# Patient Record
Sex: Male | Born: 1965 | Race: White | Hispanic: No | Marital: Married | Smoking: Never smoker
Health system: Southern US, Community
[De-identification: ages and names within clinical notes are randomized; demographics above are authoritative.]

## PROBLEM LIST (undated history)

## (undated) DIAGNOSIS — I10 Essential (primary) hypertension: Secondary | ICD-10-CM

## (undated) DIAGNOSIS — E785 Hyperlipidemia, unspecified: Secondary | ICD-10-CM

## (undated) DIAGNOSIS — I4891 Unspecified atrial fibrillation: Secondary | ICD-10-CM

## (undated) DIAGNOSIS — R931 Abnormal findings on diagnostic imaging of heart and coronary circulation: Secondary | ICD-10-CM

## (undated) DIAGNOSIS — K219 Gastro-esophageal reflux disease without esophagitis: Secondary | ICD-10-CM

## (undated) HISTORY — PX: POLYPECTOMY: SHX149

## (undated) HISTORY — PX: TONSILLECTOMY AND ADENOIDECTOMY: SHX28

## (undated) HISTORY — DX: Abnormal findings on diagnostic imaging of heart and coronary circulation: R93.1

## (undated) HISTORY — DX: Unspecified atrial fibrillation: I48.91

## (undated) HISTORY — PX: HERNIA REPAIR: SHX51

## (undated) HISTORY — DX: Gastro-esophageal reflux disease without esophagitis: K21.9

## (undated) HISTORY — PX: COLONOSCOPY: SHX174

## (undated) HISTORY — DX: Essential (primary) hypertension: I10

---

## 1993-10-22 HISTORY — PX: ANTERIOR CRUCIATE LIGAMENT REPAIR: SHX115

## 2011-10-01 ENCOUNTER — Ambulatory Visit (INDEPENDENT_AMBULATORY_CARE_PROVIDER_SITE_OTHER): Payer: BC Managed Care – PPO

## 2011-10-01 DIAGNOSIS — Z23 Encounter for immunization: Secondary | ICD-10-CM

## 2011-10-01 DIAGNOSIS — J069 Acute upper respiratory infection, unspecified: Secondary | ICD-10-CM

## 2012-02-21 ENCOUNTER — Encounter: Payer: Self-pay | Admitting: Family Medicine

## 2012-02-21 ENCOUNTER — Ambulatory Visit (INDEPENDENT_AMBULATORY_CARE_PROVIDER_SITE_OTHER): Payer: BC Managed Care – PPO | Admitting: Family Medicine

## 2012-02-21 VITALS — BP 147/99 | HR 62 | Temp 97.4°F | Resp 16 | Ht 72.5 in | Wt 193.4 lb

## 2012-02-21 DIAGNOSIS — Z23 Encounter for immunization: Secondary | ICD-10-CM

## 2012-02-21 DIAGNOSIS — R030 Elevated blood-pressure reading, without diagnosis of hypertension: Secondary | ICD-10-CM

## 2012-02-21 DIAGNOSIS — Z808 Family history of malignant neoplasm of other organs or systems: Secondary | ICD-10-CM

## 2012-02-21 DIAGNOSIS — Z0000 Encounter for general adult medical examination without abnormal findings: Secondary | ICD-10-CM

## 2012-02-21 DIAGNOSIS — Z13220 Encounter for screening for lipoid disorders: Secondary | ICD-10-CM

## 2012-02-21 LAB — CBC WITH DIFFERENTIAL/PLATELET
Basophils Relative: 1 % (ref 0–1)
Eosinophils Absolute: 0.2 10*3/uL (ref 0.0–0.7)
Eosinophils Relative: 4 % (ref 0–5)
HCT: 44.8 % (ref 39.0–52.0)
Hemoglobin: 15.9 g/dL (ref 13.0–17.0)
Lymphs Abs: 1.6 10*3/uL (ref 0.7–4.0)
MCH: 30.2 pg (ref 26.0–34.0)
MCHC: 35.5 g/dL (ref 30.0–36.0)
MCV: 85.2 fL (ref 78.0–100.0)
Monocytes Absolute: 0.4 10*3/uL (ref 0.1–1.0)
Monocytes Relative: 8 % (ref 3–12)
Neutrophils Relative %: 57 % (ref 43–77)
RBC: 5.26 MIL/uL (ref 4.22–5.81)

## 2012-02-21 LAB — LIPID PANEL
Cholesterol: 226 mg/dL — ABNORMAL HIGH (ref 0–200)
Triglycerides: 136 mg/dL (ref <150)
VLDL: 27 mg/dL (ref 0–40)

## 2012-02-21 LAB — POCT URINALYSIS DIPSTICK
Bilirubin, UA: NEGATIVE
Blood, UA: NEGATIVE
Glucose, UA: NEGATIVE
Ketones, UA: NEGATIVE
Spec Grav, UA: 1.02
pH, UA: 6

## 2012-02-21 LAB — COMPREHENSIVE METABOLIC PANEL
CO2: 25 mEq/L (ref 19–32)
Calcium: 9.3 mg/dL (ref 8.4–10.5)
Chloride: 105 mEq/L (ref 96–112)
Glucose, Bld: 86 mg/dL (ref 70–99)
Sodium: 139 mEq/L (ref 135–145)
Total Bilirubin: 0.9 mg/dL (ref 0.3–1.2)
Total Protein: 6.9 g/dL (ref 6.0–8.3)

## 2012-02-21 NOTE — Patient Instructions (Addendum)
Vitamin D Deficiency  Not having enough vitamin D is called a deficiency. Your body needs this vitamin to keep your bones strong and healthy. Having too little of it can make your bones soft or can cause other health problems.  HOME CARE  Take all vitamins, herbs, or nutrition drinks (supplements) as told by your doctor.   Have your blood tested 2 months after taking vitamins, herbs, or nutrition drinks.   Eat foods that have vitamin D. This includes:   Dairy products, cereals, or juices with added vitamin D. Check the label.   Fatty fish like salmon or trout.   Eggs.   Oysters.   Go outside for 10 to 15 minutes when the sun is shining. Do this 3 times a week. Do not do this if you have skin cancer.   Do not use tanning beds.   Stay at a healthy weight. Lose weight if needed.   Keep all doctor visits as told.  GET HELP IF:  You have questions.   You continue to have problems.   You feel sick to your stomach (nauseous) or throw up (vomit).   You cannot go poop (constipated).   You feel confused.   You have severe belly (abdominal) or back pain.  MAKE SURE YOU:  Understand these instructions.   Will watch your condition.   Will get help right away if you are not doing well or get worse.  Document Released: 09/27/2011 Document Reviewed: 09/25/2011 ExitCare Patient Information 2012 [place], [facility]     Testicular Problems and Self-Exam Men can examine themselves easily and effectively with positive results. Monthly exams detect problems early and save lives. There are numerous causes of swelling in the testicle. Testicular cancer usually appears as a firm painless lump in the front part of the testicle. This may feel like a dull ache or heavy feeling located in the lower abdomen (belly), groin, or scrotum.  The risk is greater in men with undescended testicles and it is more common in young men. It is responsible for almost a fifth of cancers in males between ages 36  and 29. Other common causes of swellings, lumps, and testicular pain include injuries, inflammation (soreness) from infection, hydrocele, and torsion. These are a few of the reasons to do monthly self-examination of the testicles. The exam only takes minutes and could add years to your life. Get in the habit! SELF-EXAMINATION OF THE TESTICLES The testicles are easiest to examine after warm baths or showers and are more difficult to examine when you are cold. This is because the muscles attached to the testicles retract and pull them up higher or into the abdomen. While standing, roll one testicle between the thumb and forefinger. Feel for lumps, swelling, or discomfort. A normal testicle is egg shaped and feels firm. It is smooth and not tender. The spermatic cord can be felt as a firm spaghetti-like cord at the back of the testicle. It is also important to examine your groins. This is the crease between the front of your leg and your abdomen. Also, feel for enlarged lymph nodes (glands). Enlarged nodes are also a cause for you to see your caregiver for evaluation.  Self-examination of the testicles and groin areas on a regular basis will help you to know what your own testicles and groins feel like. This will help you pick up an abnormality (difference) at an earlier stage. Early discovery is the key to curing this cancer or treating other conditions. Any lump, change,  or swelling in the testicle calls for immediate evaluation by your caregiver. Cancer of the testicle does not result in impotence and it does not prevent normal intercourse or prevent having children. If your caregiver feels that medical treatment or chemotherapy could lead to infertility, sperm can be frozen for future use. It is necessary to see a caregiver as soon as possible after the discovery of a lump in a testicle. Document Released: 01/14/2001 Document Revised: 09/27/2011 Document Reviewed: 10/09/2008 [facility] Patient Information 2012  [place], [place].    Marland KitchenKeeping you healthy  Get these tests  Blood pressure- Have your blood pressure checked once a year by your healthcare provider.  Normal blood pressure is 120/80.  Weight- Have your body mass index (BMI) calculated to screen for obesity.  BMI is a measure of body fat based on height and weight. You can also calculate your own BMI at https://www.west-esparza.com/.  Cholesterol- Have your cholesterol checked regularly starting at age 63, sooner may be necessary if you have diabetes, high blood pressure, if a family member developed heart diseases at an early age or if you smoke.   Chlamydia, HIV, and other sexual transmitted disease- Get screened each year until the age of 72 then within three months of each new sexual partner.  Diabetes- Have your blood sugar checked regularly if you have high blood pressure, high cholesterol, a family history of diabetes or if you are overweight.  Get these vaccines  Flu shot- Every fall.  Tetanus shot- Every 10 years.  Menactra- Single dose; prevents meningitis.  Take these steps  Don't smoke- If you do smoke, ask your healthcare provider about quitting. For tips on how to quit, go to www.smokefree.gov or call 1-800-QUIT-NOW.  Be physically active- Exercise 5 days a week for at least 30 minutes.  If you are not already physically active start slow and gradually work up to 30 minutes of moderate physical activity.  Examples of moderate activity include walking briskly, mowing the yard, dancing, swimming bicycling, etc.  Eat a healthy diet- Eat a variety of healthy foods such as fruits, vegetables, low fat milk, low fat cheese, yogurt, lean meats, poultry, fish, beans, tofu, etc.  For more information on healthy eating, go to www.thenutritionsource.org  Drink alcohol in moderation- Limit alcohol intake two drinks or less a day.  Never drink and drive.  Dentist- Brush and floss teeth twice daily; visit your dentis twice a  year.  Depression-Your emotional health is as important as your physical health.  If you're feeling down, losing interest in things you normally enjoy please talk with your healthcare provider.  Gun Safety- If you keep a gun in your home, keep it unloaded and with the safety lock on.  Bullets should be stored separately.  Helmet use- Always wear a helmet when riding a motorcycle, bicycle, rollerblading or skateboarding.  Safe sex- If you may be exposed to a sexually transmitted infection, use a condom  Seat belts- Seat bels can save your life; always wear one.  Smoke/Carbon Monoxide detectors- These detectors need to be installed on the appropriate level of your home.  Replace batteries at least once a year.  Skin Cancer- When out in the sun, cover up and use sunscreen SPF 15 or higher. Violence- If anyone is threatening or hurting you, please tell your healthcare provider.    Hypertension As your heart beats, it forces blood through your arteries. This force is your blood pressure. If the pressure is too high, it is called hypertension (  HTN) or high blood pressure. HTN is dangerous because you may have it and not know it. High blood pressure may mean that your heart has to work harder to pump blood. Your arteries may be narrow or stiff. The extra work puts you at risk for heart disease, stroke, and other problems.  Blood pressure consists of two numbers, a higher number over a lower, 110/72, for example. It is stated as "110 over 72." The ideal is below 120 for the top number (systolic) and under 80 for the bottom (diastolic). Write down your blood pressure today. You should pay close attention to your blood pressure if you have certain conditions such as: Heart failure.  Prior heart attack.  Diabetes  Chronic kidney disease.  Prior stroke.  Multiple risk factors for heart disease.  To see if you have HTN, your blood pressure should be measured while you are seated with your arm held at  the level of the heart. It should be measured at least twice. A one-time elevated blood pressure reading (especially in the Emergency Department) does not mean that you need treatment. There may be conditions in which the blood pressure is different between your right and left arms. It is important to see your caregiver soon for a recheck. Most people have essential hypertension which means that there is not a specific cause. This type of high blood pressure may be lowered by changing lifestyle factors such as: Stress.  Smoking.  Lack of exercise.  Excessive weight.  Drug/tobacco/alcohol use.  Eating less salt.  Most people do not have symptoms from high blood pressure until it has caused damage to the body. Effective treatment can often prevent, delay or reduce that damage. TREATMENT  When a cause has been identified, treatment for high blood pressure is directed at the cause. There are a large number of medications to treat HTN. These fall into several categories, and your caregiver will help you select the medicines that are best for you. Medications may have side effects. You should review side effects with your caregiver. If your blood pressure stays high after you have made lifestyle changes or started on medicines,  Your medication(s) may need to be changed.  Other problems may need to be addressed.  Be certain you understand your prescriptions, and know how and when to take your medicine.  Be sure to follow up with your caregiver within the time frame advised (usually within two weeks) to have your blood pressure rechecked and to review your medications.  If you are taking more than one medicine to lower your blood pressure, make sure you know how and at what times they should be taken. Taking two medicines at the same time can result in blood pressure that is too low.  SEEK IMMEDIATE MEDICAL CARE IF: You develop a severe headache, blurred or changing vision, or confusion.  You have  unusual weakness or numbness, or a faint feeling.  You have severe chest or abdominal pain, vomiting, or breathing problems.  MAKE SURE YOU:  Understand these instructions.  Will watch your condition.  Will get help right away if you are not doing well or get worse.  Document Released: 10/08/2005 Document Revised: 09/27/2011 Document Reviewed: 05/28/2008  [facility] Patient Information 2012 [place], [place].

## 2012-02-23 ENCOUNTER — Encounter: Payer: Self-pay | Admitting: Family Medicine

## 2012-02-23 DIAGNOSIS — J309 Allergic rhinitis, unspecified: Secondary | ICD-10-CM | POA: Insufficient documentation

## 2012-02-23 DIAGNOSIS — Z808 Family history of malignant neoplasm of other organs or systems: Secondary | ICD-10-CM | POA: Insufficient documentation

## 2012-02-23 DIAGNOSIS — E785 Hyperlipidemia, unspecified: Secondary | ICD-10-CM | POA: Insufficient documentation

## 2012-02-23 DIAGNOSIS — R030 Elevated blood-pressure reading, without diagnosis of hypertension: Secondary | ICD-10-CM | POA: Insufficient documentation

## 2012-02-23 NOTE — Progress Notes (Signed)
  Subjective:    Patient ID: John Odonnell, male    DOB: 24-Apr-1966, 45 y.o.   MRN: [id number]  HPI  This Cauc male is here for CPE. He reports no chronic problems except joint pain and stiffness.  He is married with 2 children, is a nonsmoker who consumes 6-8 drinks monthly and works as an Scientist, product/process development. He stays active playing tennis and other activities.  Last eye exam: 09/2011 Last dental exam: 2012    Review of Systems  Musculoskeletal: Positive for arthralgias. Negative for myalgias, joint swelling and gait problem.  All other systems reviewed and are negative.       Objective:   Physical Exam  Nursing note and vitals reviewed. Constitutional: He is oriented to person, place, and time. He appears well-developed and well-nourished. No distress.  HENT:  Head: Normocephalic and atraumatic.  Right Ear: External ear normal.  Left Ear: External ear normal.  Nose: Nose normal.  Mouth/Throat: Oropharynx is clear and moist.  Eyes: Conjunctivae and EOM are normal. Pupils are equal, round, and reactive to light. No scleral icterus.  Neck: Normal range of motion. Neck supple. No thyromegaly present.  Cardiovascular: Normal rate, regular rhythm and normal heart sounds.  Exam reveals no gallop and no friction rub.   No murmur heard. Pulmonary/Chest: Effort normal and breath sounds normal. No respiratory distress.  Abdominal: Soft. Bowel sounds are normal. He exhibits no mass. There is no tenderness. There is no guarding.  Genitourinary: Testes normal and penis normal.  Musculoskeletal: Normal range of motion. He exhibits no edema and no tenderness.       Well healed scar on left knee  Lymphadenopathy:    He has no cervical adenopathy.       Right: No inguinal adenopathy present.       Left: No inguinal adenopathy present.  Neurological: He is alert and oriented to person, place, and time. He has normal reflexes. No cranial nerve deficit. Coordination normal.  Skin: Skin is warm. No  rash noted. No erythema.  Psychiatric: He has a normal mood and affect. His behavior is normal. Judgment and thought content normal.          Assessment & Plan:   1. Routine general medical examination at a health care facility  Comprehensive metabolic panel, CBC with Differential, Vitamin D, 25-hydroxy  2. Screening for hyperlipidemia  Lipid panel  3. Need for prophylactic vaccination with combined diphtheria-tetanus-pertussis (DTP) vaccine  Tdap vaccine greater than or equal to 7yo IM  4. Blood pressure elevated without history of HTN  (BP:09/2011 - 142/94; 12/2009-131/88) Recheck BP in 3 months

## 2012-02-28 NOTE — Progress Notes (Signed)
Quick Note:  Please call pt and advise that the following labs are abnormal...  Lipids are elevated. I recommend eating much healthier- avoid fried foods and processed foods as well as a lot of "fast foods". Eat more fruits and vegetables and whole grains/ fiber. Regular exercise will help to raise your HDL ("good")  cholesterol and lower the LDL ("bad") cholesterol. Omega 3 Fish Oil 1200 mg 1 capsule daily will help.   These values can be checked again at your next annual exam. All other labs are normal. Vitamin D is low normal- a good multivitamin for men is a good idea and try to get some sun exposure most days of the week.   Copy to pt.  ______

## 2012-03-05 ENCOUNTER — Encounter: Payer: Self-pay | Admitting: Internal Medicine

## 2012-05-23 ENCOUNTER — Ambulatory Visit: Payer: BC Managed Care – PPO | Admitting: Family Medicine

## 2012-11-07 ENCOUNTER — Ambulatory Visit (INDEPENDENT_AMBULATORY_CARE_PROVIDER_SITE_OTHER): Payer: BC Managed Care – PPO | Admitting: Physician Assistant

## 2012-11-07 VITALS — BP 143/91 | HR 87 | Temp 98.5°F | Resp 18 | Ht 73.0 in | Wt 191.2 lb

## 2012-11-07 DIAGNOSIS — R059 Cough, unspecified: Secondary | ICD-10-CM

## 2012-11-07 DIAGNOSIS — R05 Cough: Secondary | ICD-10-CM

## 2012-11-07 DIAGNOSIS — J3489 Other specified disorders of nose and nasal sinuses: Secondary | ICD-10-CM

## 2012-11-07 DIAGNOSIS — R0981 Nasal congestion: Secondary | ICD-10-CM

## 2012-11-07 DIAGNOSIS — J329 Chronic sinusitis, unspecified: Secondary | ICD-10-CM

## 2012-11-07 MED ORDER — AMOXICILLIN 875 MG PO TABS: 875.0000 mg | ORAL_TABLET | Freq: Two times a day (BID) | ORAL | Status: DC

## 2012-11-07 MED ORDER — IPRATROPIUM BROMIDE 0.03 % NA SOLN: 2.0000 | Freq: Two times a day (BID) | NASAL | Status: DC

## 2012-11-07 NOTE — Progress Notes (Signed)
  Subjective:    Patient ID: John Odonnell, male    DOB: 07-Jan-1966, 47 y.o.   MRN: [id number]  HPI 47 year old male presents with 1 week history of nasal congestion, postnasal drainage, sore throat, slight dry cough, and sinus pressure.  Also complains of aching pain in bilateral ears. Denies fever, chills, nausea, vomiting, headache, or dizziness. His wife had a similar illness last week.  He did have his flu shot this year.  No known flu or strep contacts. Does have history of HTN.  Has been taking Mucinex which has helped slightly.     Review of Systems  Constitutional: Negative for fever and chills.  HENT: Positive for ear pain, congestion, sore throat, rhinorrhea and postnasal drip.   Respiratory: Positive for cough. Negative for chest tightness, shortness of breath and wheezing.   Cardiovascular: Negative for chest pain.  Gastrointestinal: Negative for nausea, vomiting and abdominal pain.  Skin: Negative for rash.  Neurological: Negative for dizziness and headaches.  All other systems reviewed and are negative.       Objective:   Physical Exam  Constitutional: He is oriented to person, place, and time. He appears well-developed and well-nourished.  HENT:  Head: Normocephalic and atraumatic.  Right Ear: External ear normal.  Left Ear: External ear normal.  Mouth/Throat: Oropharynx is clear and moist. No oropharyngeal exudate (clear postnasal drainage).  Eyes: Conjunctivae normal are normal.  Neck: Normal range of motion. Neck supple.  Cardiovascular: Normal rate, regular rhythm and normal heart sounds.   Pulmonary/Chest: Effort normal and breath sounds normal.  Lymphadenopathy:    He has no cervical adenopathy.  Neurological: He is alert and oriented to person, place, and time.  Psychiatric: He has a normal mood and affect. His behavior is normal. Judgment and thought content normal.          Assessment & Plan:   1. Sinusitis  amoxicillin (AMOXIL) 875 MG tablet,  ipratropium (ATROVENT) 0.03 % nasal spray  2. Nasal congestion    3. Cough    Start amoxicillin 875 mg bid Atrovent NS bid Continue Mucinex as directed Follow up if symptoms worsen or fail to improve. Encouraged him to follow up with Dr. Audria Nine for recheck of HTN as his BP is slightly elevated today.

## 2013-07-24 ENCOUNTER — Encounter: Payer: Self-pay | Admitting: Family Medicine

## 2013-07-24 ENCOUNTER — Ambulatory Visit (INDEPENDENT_AMBULATORY_CARE_PROVIDER_SITE_OTHER): Payer: BC Managed Care – PPO | Admitting: Family Medicine

## 2013-07-24 VITALS — BP 138/90 | HR 57 | Temp 97.8°F | Resp 16 | Ht 72.5 in | Wt 176.0 lb

## 2013-07-24 DIAGNOSIS — E7800 Pure hypercholesterolemia, unspecified: Secondary | ICD-10-CM

## 2013-07-24 DIAGNOSIS — Z0000 Encounter for general adult medical examination without abnormal findings: Secondary | ICD-10-CM

## 2013-07-24 LAB — POCT URINALYSIS DIPSTICK
Blood, UA: NEGATIVE
Glucose, UA: NEGATIVE
Leukocytes, UA: NEGATIVE
Protein, UA: NEGATIVE
Spec Grav, UA: <=1.005
Urobilinogen, UA: 0.2
pH, UA: 6

## 2013-07-24 LAB — LIPID PANEL
HDL: 47 mg/dL (ref >39)
LDL Cholesterol: 122 mg/dL — ABNORMAL HIGH (ref 0–99)
Total CHOL/HDL Ratio: 4.2 Ratio
Triglycerides: 137 mg/dL (ref <150)

## 2013-07-24 LAB — BASIC METABOLIC PANEL
CO2: 27 mEq/L (ref 19–32)
Calcium: 9.5 mg/dL (ref 8.4–10.5)
Creat: 0.99 mg/dL (ref 0.50–1.35)
Sodium: 139 mEq/L (ref 135–145)

## 2013-07-24 NOTE — Patient Instructions (Signed)
Keeping you healthy  Get these tests  Blood pressure- Have your blood pressure checked once a year by your healthcare provider.  Normal blood pressure is 120/80.  Weight- Have your body mass index (BMI) calculated to screen for obesity.  BMI is a measure of body fat based on height and weight. You can also calculate your own BMI at https://www.west-esparza.com/.  Cholesterol- Have your cholesterol checked regularly starting at age 45, sooner may be necessary if you have diabetes, high blood pressure, if a family member developed heart diseases at an early age or if you smoke.   Chlamydia, HIV, and other sexual transmitted disease- Get screened each year until the age of 71 then within three months of each new sexual partner.  Diabetes- Have your blood sugar checked regularly if you have high blood pressure, high cholesterol, a family history of diabetes or if you are overweight.  Get these vaccines  Flu shot- Every fall. You will be getting this vaccine at your workplace.  Tetanus shot- Every 10 years. This is current (Tdap in 2013). Next Tetanus due in 2023.   Take these steps  Don't smoke- If you do smoke, ask your healthcare provider about quitting. For tips on how to quit, go to www.smokefree.gov or call 1-800-QUIT-NOW.  Be physically active- Exercise 5 days a week for at least 30 minutes.  If you are not already physically active start slow and gradually work up to 30 minutes of moderate physical activity.  Examples of moderate activity include walking briskly, mowing the yard, dancing, swimming bicycling, etc.  Eat a healthy diet- Eat a variety of healthy foods such as fruits, vegetables, low fat milk, low fat cheese, yogurt, lean meats, poultry, fish, beans, tofu, etc.  For more information on healthy eating, go to www.thenutritionsource.org  Drink alcohol in moderation- Limit alcohol intake two drinks or less a day.  Never drink and drive.  Dentist- Brush and floss teeth twice  daily; visit your dentis twice a year.  Depression-Your emotional health is as important as your physical health.  If you're feeling down, losing interest in things you normally enjoy please talk with your healthcare provider.  Gun Safety- If you keep a gun in your home, keep it unloaded and with the safety lock on.  Bullets should be stored separately.  Helmet use- Always wear a helmet when riding a motorcycle, bicycle, rollerblading or skateboarding.  Safe sex- If you may be exposed to a sexually transmitted infection, use a condom  Seat belts- Seat bels can save your life; always wear one.  Smoke/Carbon Monoxide detectors- These detectors need to be installed on the appropriate level of your home.  Replace batteries at least once a year.  Skin Cancer- When out in the sun, cover up and use sunscreen SPF 15 or higher.  Violence- If anyone is threatening or hurting you, please tell your healthcare provider.

## 2013-07-25 NOTE — Progress Notes (Signed)
Quick Note:  Please advise pt regarding following labs...  Lipid panel is better compared to 1 year ago. Though LDL ("bad") cholesterol is still above normal, heart disease risk is lower. Good nutrition and regular exercise will help improve all these numbers.   All other labs are normal.  Copy to pt. ______

## 2013-07-26 NOTE — Progress Notes (Signed)
Subjective:    Patient ID: John Odonnell, male    DOB: Jul 10, 1966, 47 y.o.   MRN: [id number]  HPI This 47 y.o. Cauc male is here for CPE; he enjoys good health and has no new concerns today. 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 Review of labs from May 2013 revealed elevated total and LDL cholesterol values. Pt has been working on lifestyle modifications.  Family Hx is significant for depression and anxiety.  Patient Active Problem List   Diagnosis Date Noted  . Hyperlipidemia 02/23/2012  . Blood pressure elevated without history of HTN 02/23/2012  . Allergic rhinitis 02/23/2012  . Family history of melanoma 02/23/2012   PMHx, Soc Hx and Fam Hx reviewed.   Review of Systems  Allergic/Immunologic: Positive for environmental allergies.  All other systems reviewed and are negative.       Objective:   Physical Exam  Nursing note and vitals reviewed. Constitutional: He is oriented to person, place, and time. Vital signs are normal. He appears well-developed and well-nourished. No distress.  HENT:  Head: Normocephalic and atraumatic.  Right Ear: Hearing, tympanic membrane, external ear and ear canal normal.  Left Ear: Hearing, tympanic membrane, external ear and ear canal normal.  Nose: Nose normal. No mucosal edema, rhinorrhea, nasal deformity or septal deviation. Right sinus exhibits no maxillary sinus tenderness and no frontal sinus tenderness. Left sinus exhibits no maxillary sinus tenderness and no frontal sinus tenderness.  Mouth/Throat: Uvula is midline, oropharynx is clear and moist and mucous membranes are normal. No oral lesions. Normal dentition. No dental caries.  Eyes: Conjunctivae, EOM and lids are normal. Pupils are equal, round, and reactive to light. No scleral icterus.  Fundoscopic exam:      The right eye shows no arteriolar narrowing, no AV nicking and no papilledema. The right eye shows red reflex.       The left eye shows no  arteriolar narrowing, no AV nicking and no papilledema. The left eye shows red reflex.  Neck: Normal range of motion and full passive range of motion without pain. Neck supple. No spinous process tenderness and no muscular tenderness present. No mass and no thyromegaly present.  Cardiovascular: Normal rate, regular rhythm, S1 normal, S2 normal, normal heart sounds and normal pulses.   No extrasystoles are present. PMI is not displaced.  Exam reveals no gallop and no friction rub.   No murmur heard. Pulses:      Carotid pulses are 2+ on the right side, and 2+ on the left side.      Radial pulses are 2+ on the right side, and 2+ on the left side.       Femoral pulses are 2+ on the right side, and 2+ on the left side.      Popliteal pulses are 2+ on the right side, and 2+ on the left side.       Dorsalis pedis pulses are 2+ on the right side, and 2+ on the left side.       Posterior tibial pulses are 2+ on the right side, and 2+ on the left side.  Pulmonary/Chest: Effort normal and breath sounds normal. No respiratory distress.  Abdominal: Soft. Normal appearance and bowel sounds are normal. He exhibits no distension, no pulsatile midline mass and no mass. There is no hepatosplenomegaly. There is no tenderness. There is no guarding and no CVA tenderness. No hernia.  Musculoskeletal: Normal range of motion. He exhibits no edema and no tenderness.  Lymphadenopathy:       Head (right side): No submental, no submandibular, no tonsillar, no preauricular, no posterior auricular and no occipital adenopathy present.       Head (left side): No submental, no submandibular, no tonsillar, no preauricular, no posterior auricular and no occipital adenopathy present.    He has no cervical adenopathy.    He has no axillary adenopathy.       Right: No inguinal and no supraclavicular adenopathy present.       Left: No inguinal and no supraclavicular adenopathy present.  Neurological: He is alert and oriented to  person, place, and time. He has normal strength and normal reflexes. He is not disoriented. He displays no atrophy. No cranial nerve deficit or sensory deficit. He exhibits normal muscle tone. He displays a negative Romberg sign. Coordination and gait normal.  Skin: Skin is warm, dry and intact. No ecchymosis, no lesion and no rash noted. He is not diaphoretic. No cyanosis or erythema. No pallor. Nails show no clubbing.  Psychiatric: He has a normal mood and affect. His speech is normal and behavior is normal. Judgment and thought content normal. Cognition and memory are normal.       Assessment & Plan:  Routine general medical examination at a health care facility - Plan: Lipid panel, Basic metabolic panel, POCT urinalysis dipstick  Pure hypercholesterolemia - continue healthy nutrition and regular physical activity.   Plan: Lipid panel, Basic metabolic panel

## 2014-02-17 ENCOUNTER — Emergency Department (HOSPITAL_COMMUNITY): Payer: BC Managed Care – PPO

## 2014-02-17 ENCOUNTER — Encounter (HOSPITAL_COMMUNITY): Payer: Self-pay | Admitting: Emergency Medicine

## 2014-02-17 ENCOUNTER — Emergency Department (HOSPITAL_COMMUNITY)
Admission: EM | Admit: 2014-02-17 | Discharge: 2014-02-17 | Disposition: A | Discharge status: Home / Self Care | Payer: BC Managed Care – PPO | Attending: Emergency Medicine | Admitting: Emergency Medicine

## 2014-02-17 DIAGNOSIS — R61 Generalized hyperhidrosis: Secondary | ICD-10-CM | POA: Insufficient documentation

## 2014-02-17 DIAGNOSIS — R6883 Chills (without fever): Secondary | ICD-10-CM | POA: Insufficient documentation

## 2014-02-17 DIAGNOSIS — Z862 Personal history of diseases of the blood and blood-forming organs and certain disorders involving the immune mechanism: Secondary | ICD-10-CM | POA: Insufficient documentation

## 2014-02-17 DIAGNOSIS — R42 Dizziness and giddiness: Secondary | ICD-10-CM

## 2014-02-17 DIAGNOSIS — I4891 Unspecified atrial fibrillation: Secondary | ICD-10-CM

## 2014-02-17 DIAGNOSIS — R112 Nausea with vomiting, unspecified: Secondary | ICD-10-CM | POA: Insufficient documentation

## 2014-02-17 DIAGNOSIS — Z792 Long term (current) use of antibiotics: Secondary | ICD-10-CM | POA: Insufficient documentation

## 2014-02-17 DIAGNOSIS — Z8639 Personal history of other endocrine, nutritional and metabolic disease: Secondary | ICD-10-CM | POA: Insufficient documentation

## 2014-02-17 DIAGNOSIS — Z79899 Other long term (current) drug therapy: Secondary | ICD-10-CM | POA: Insufficient documentation

## 2014-02-17 HISTORY — DX: Hyperlipidemia, unspecified: E78.5

## 2014-02-17 LAB — CBC
HCT: 45.3 % (ref 39.0–52.0)
Hemoglobin: 16.6 g/dL (ref 13.0–17.0)
MCH: 30.7 pg (ref 26.0–34.0)
MCHC: 36.6 g/dL — ABNORMAL HIGH (ref 30.0–36.0)
MCV: 83.9 fL (ref 78.0–100.0)
Platelets: 173 10*3/uL (ref 150–400)
RBC: 5.4 MIL/uL (ref 4.22–5.81)
RDW: 12 % (ref 11.5–15.5)
WBC: 12.7 10*3/uL — ABNORMAL HIGH (ref 4.0–10.5)

## 2014-02-17 LAB — BASIC METABOLIC PANEL
BUN: 15 mg/dL (ref 6–23)
CO2: 26 mEq/L (ref 19–32)
Calcium: 9 mg/dL (ref 8.4–10.5)
Chloride: 102 mEq/L (ref 96–112)
Creatinine, Ser: 0.97 mg/dL (ref 0.50–1.35)
GFR calc Af Amer: >90 mL/min (ref >90)
GFR calc non Af Amer: >90 mL/min (ref >90)
Glucose, Bld: 137 mg/dL — ABNORMAL HIGH (ref 70–99)
Potassium: 3.8 mEq/L (ref 3.7–5.3)
Sodium: 142 mEq/L (ref 137–147)

## 2014-02-17 LAB — TSH: TSH: 2.41 u[IU]/mL (ref 0.350–4.500)

## 2014-02-17 LAB — MAGNESIUM: Magnesium: 1.9 mg/dL (ref 1.5–2.5)

## 2014-02-17 LAB — TROPONIN I: Troponin I: <0.3 ng/mL (ref <0.30)

## 2014-02-17 LAB — I-STAT TROPONIN, ED: Troponin i, poc: 0 ng/mL (ref 0.00–0.08)

## 2014-02-17 MED ORDER — ONDANSETRON HCL 4 MG/2ML IJ SOLN
4.0000 mg | Freq: Once | INTRAMUSCULAR | Status: AC
  Administered 2014-02-17: 4 mg via INTRAVENOUS

## 2014-02-17 MED ORDER — DEXTROSE 5 % IV SOLN
10.0000 mg/h | INTRAVENOUS | Status: DC
  Administered 2014-02-17: 10 mg/h via INTRAVENOUS

## 2014-02-17 MED ORDER — DILTIAZEM LOAD VIA INFUSION
10.0000 mg | Freq: Once | INTRAVENOUS | Status: AC
  Administered 2014-02-17: 10 mg via INTRAVENOUS

## 2014-02-17 MED ORDER — MECLIZINE HCL 12.5 MG PO TABS: 12.5000 mg | ORAL_TABLET | Freq: Three times a day (TID) | ORAL | Status: DC | PRN

## 2014-02-17 MED ORDER — ETOMIDATE 2 MG/ML IV SOLN
5.0000 mg | Freq: Once | INTRAVENOUS | Status: AC
  Administered 2014-02-17: 5 mg via INTRAVENOUS
  Filled 2014-02-17: qty 10

## 2014-02-17 MED ORDER — MECLIZINE HCL 25 MG PO TABS
25.0000 mg | ORAL_TABLET | Freq: Once | ORAL | Status: AC
  Administered 2014-02-17: 25 mg via ORAL
  Filled 2014-02-17: qty 1

## 2014-02-17 MED ORDER — PROMETHAZINE HCL 25 MG/ML IJ SOLN
12.5000 mg | Freq: Once | INTRAMUSCULAR | Status: AC
  Administered 2014-02-17: 12.5 mg via INTRAVENOUS
  Filled 2014-02-17: qty 1

## 2014-02-17 MED ORDER — PROMETHAZINE HCL 25 MG PO TABS: 25.0000 mg | ORAL_TABLET | Freq: Four times a day (QID) | ORAL | Status: DC | PRN

## 2014-02-17 MED ORDER — DILTIAZEM HCL ER COATED BEADS 120 MG PO CP24
120.0000 mg | ORAL_CAPSULE | Freq: Once | ORAL | Status: AC
  Administered 2014-02-17: 120 mg via ORAL
  Filled 2014-02-17: qty 1

## 2014-02-17 NOTE — ED Notes (Signed)
MD at bedside. 

## 2014-02-17 NOTE — ED Notes (Addendum)
Crash cart, suction, airway precautions at bedside. Consent obtained.

## 2014-02-17 NOTE — ED Notes (Signed)
Pt states that he is less dizzy at this time.

## 2014-02-17 NOTE — ED Notes (Addendum)
Patient woke up this am with nausea and vomiting.  Patient's wife called EMS, patient was found in rapid afib with a rate of 180s to 200's.  Patient did have some shortness of breath, no chest pain.  Patient was given 20mg  of Diltiazem and 4mg  of Zofran.  Patient does have depression in majority of leads on EKG per EMS.  Patient is pale, nauseated and diaphoretic upon arrival to ED.  Patient is CAOx3.  Patient has no history of Afib.

## 2014-02-17 NOTE — ED Notes (Signed)
Pt ambulated to restroom. Pt reports that he still has dizziness.

## 2014-02-17 NOTE — ED Notes (Signed)
PA at bedside.

## 2014-02-17 NOTE — ED Notes (Signed)
Patient continues with nausea.  MD and PA aware.

## 2014-02-17 NOTE — ED Provider Notes (Signed)
CSN: [id number]     Arrival date & time 02/17/14  2505 History   First MD Initiated Contact with Patient 02/17/14 [id number]     Chief Complaint  Patient presents with  . Atrial Fibrillation     (Consider location/radiation/quality/duration/timing/severity/associated sxs/prior Treatment) HPI Pt is a 48yo male brought to ED via EMS c/o nausea and vomiting, found to be in rapid afib with a rate of 180s to 200s.  Pt reports SOB w/o chest pain. States he woke around 4am feeling nauseated and dizzy, described more as a lightheadedness. States he felt he had food poisoning but kept feeling bad so wife called EMS. Pt received 20mg  Diltiazem and 4mg  Zofran by EMS en route.  Pt still c/o nausea upon arrival to ED. Reports feeling well yesterday and last night prior to going to bed. Denies hx of similar feeling.  Denies hx of known CAD, PE, or family hx of CAD. Denies recent travel or illness. Reports recreational drinking, denies smoking. Denies drug use, including cocaine or heroine.    Past Medical History  Diagnosis Date  . Hyperlipidemia    Past Surgical History  Procedure Laterality Date  . Tonsillectomy and adenoidectomy    . Anterior cruciate ligament repair  1995    Left knee  . Hernia repair     Family History  Problem Relation Age of Onset  . Depression Mother   . Hypertension Mother   . Depression Sister   . Depression Brother   . Mental illness Maternal Grandmother   . Heart disease Paternal Grandfather    History  Substance Use Topics  . Smoking status: Never Smoker   . Smokeless tobacco: Not on file  . Alcohol Use: Yes    Review of Systems  Constitutional: Positive for chills and diaphoresis. Negative for fever and fatigue.  Respiratory: Positive for shortness of breath.   Cardiovascular: Positive for palpitations. Negative for chest pain and leg swelling.  Gastrointestinal: Positive for nausea and vomiting. Negative for abdominal pain.  Neurological: Positive for  weakness.  All other systems reviewed and are negative.     Allergies  Review of patient's allergies indicates no known allergies.  Home Medications   Prior to Admission medications   Medication Sig Start Date End Date Taking? Authorizing Provider  amoxicillin (AMOXIL) 875 MG tablet Take 1 tablet (875 mg total) by mouth 2 (two) times daily. 11/07/12   Heather Elnora Morrison, PA-C  ipratropium (ATROVENT) 0.03 % nasal spray Place 2 sprays into the nose 2 (two) times daily. 11/07/12   Heather M Marte, PA-C   BP 136/94  Pulse 72  Resp 13  SpO2 95% Physical Exam  Nursing note and vitals reviewed. Constitutional: He appears well-developed and well-nourished. No distress.  Pt lying flat on exam bed, calm, appears pale.  HENT:  Head: Normocephalic and atraumatic.  Eyes: Conjunctivae are normal. No scleral icterus.  Neck: Normal range of motion.  Cardiovascular: Normal rate and normal heart sounds.  An irregularly irregular rhythm present.  Pulmonary/Chest: Effort normal and breath sounds normal. No respiratory distress. He has no wheezes. He has no rales. He exhibits no tenderness.  Abdominal: Soft. Bowel sounds are normal. He exhibits no distension and no mass. There is no tenderness. There is no rebound and no guarding.  Musculoskeletal: Normal range of motion.  Neurological: He is alert.  Skin: Skin is warm and dry. He is not diaphoretic.  Cool and dry    ED Course  Procedures   CRITICAL CARE Performed  by: Noland Fordyce   Total critical care time: 30min  Critical care time was exclusive of separately billable procedures and treating other patients.  Critical care was necessary to treat or prevent imminent or life-threatening deterioration.  Critical care was time spent personally by me on the following activities: development of treatment plan with patient and/or surrogate as well as nursing, discussions with consultants, evaluation of patient's response to treatment, examination  of patient, obtaining history from patient or surrogate, ordering and performing treatments and interventions, ordering and review of laboratory studies, ordering and review of radiographic studies, pulse oximetry and re-evaluation of patient's condition.   Labs Review Labs Reviewed  CBC - Abnormal; Notable for the following:    WBC 12.7 (*)    MCHC 36.6 (*)    All other components within normal limits  BASIC METABOLIC PANEL - Abnormal; Notable for the following:    Glucose, Bld 137 (*)    All other components within normal limits  TROPONIN I  TSH  MAGNESIUM  I-STAT TROPOININ, ED    Imaging Review Dg Chest Portable 1 View  02/17/2014   CLINICAL DATA:  Atrial fibrillation.  EXAM: PORTABLE CHEST - 1 VIEW  COMPARISON:  03/17/2009  FINDINGS: The cardiomediastinal silhouette is within normal limits. The lungs are well inflated and clear. There is no evidence of pleural effusion or pneumothorax. No acute osseous abnormality is identified.  IMPRESSION: No active disease.   Electronically Signed   By: Logan Bores   On: 02/17/2014 07:26     EKG Interpretation None      MDM   Final diagnoses:  Rapid atrial fibrillation  Vertigo  Nausea & vomiting     Pt is a 47yo relatively healthy male presenting with rapid afib, nauseated.  Denies chest pain. No previous hx of same. No FH of CAD.  No hx of drug use or smoking.  Discussed pt with Dr. Alvino Chapel who also examined pt. CHADS2 score- 0.   Pt started on Cardizem drip.  Plan to get labs: CBC, BMP, Troponin, and Magnesium.   Labs: CBC-mildly elevated WBC-12.7, BMP, normal. Troponin-negative. Magnesium-normal. CXR: no active disease.  8:22 AM pt has not converted back to NSR with Cardizem drip, plan is to cardiovert pt.  Dr. Alvino Chapel consulted with cardiology  8:49 AM Pt successfully converted back to NSR with electrical cardioversion under supervision of Dr. Alvino Chapel.    9:59 AM pt continues to c/o nausea. Pt was repositioned to sitting  up in bed and provided ice water w/o relief. Pt states he feels like when he moves his head the room is spinning. States if he stood up he might vomit.  Initially denied previous hx of vertigo but states he does recall instances when he moved his head too fast he had a "weird sensation" but it only lasted for a second.  Will give meclizine and reassess.   Pt still c/o dizziness.  Discussed pt with Dr. Alvino Chapel.  MR brain ordered to ensure no evidence of CVA/TIA.  MR Brain: no focal brain pathology. No cause of acute onset dizziness is identified.  Tortuous vessels suggesting a hx of HTN. Small amount of fluid layering in right maxillary sinus.    Discussed results with pt. Pt states he is feeling better, "not 100% but better" after lying and resting for MRI.  Discussed pt with Dr. Alvino Chapel, agreed he may be discharged home to f/u with PCP.  Pt also requested cardiology referral. Advised to f/u with Dr. Servando Snare, cardiology, for  further evaluation and tx as needed for afib.  Rx: meclizine and phenergan. Return precautions provided. Pt verbalized understanding and agreement with tx plan.      Noland Fordyce, PA-C 02/17/14 [place], PA-C 02/17/14 [phone]

## 2014-02-17 NOTE — ED Notes (Signed)
Family at bedside. Pt alert and oriented NAD noted. Denies pain.

## 2014-02-17 NOTE — Sedation Documentation (Signed)
Family updated as to patient's status.

## 2014-02-17 NOTE — Sedation Documentation (Signed)
Patient denies pain and is resting comfortably.  

## 2014-02-17 NOTE — Sedation Documentation (Signed)
EKG completed pt in sinus rhythm. Pt in NAD. Pt alert and oriented. MAE well.

## 2014-02-17 NOTE — ED Notes (Signed)
MD at bedside. Explaining cardioversion procedure with pt and receiving consent.

## 2014-02-17 NOTE — Discharge Instructions (Signed)
Atrial Fibrillation  Atrial fibrillation is a type of irregular heart rhythm (arrhythmia). During atrial fibrillation, the upper chambers of the heart (atria) quiver continuously in a chaotic pattern. This causes an irregular and often rapid heart rate.   Atrial fibrillation is the result of the heart becoming overloaded with disorganized signals that tell it to beat. These signals are normally released one at a time by a part of the right atrium called the sinoatrial node. They then travel from the atria to the lower chambers of the heart (ventricles), causing the atria and ventricles to contract and pump blood as they pass. In atrial fibrillation, parts of the atria outside of the sinoatrial node also release these signals. This results in two problems. First, the atria receive so many signals that they do not have time to fully contract. Second, the ventricles, which can only receive one signal at a time, beat irregularly and out of rhythm with the atria.   There are three types of atrial fibrillation:    Paroxysmal Paroxysmal atrial fibrillation starts suddenly and stops on its own within a week.    Persistent Persistent atrial fibrillation lasts for more than a week. It may stop on its own or with treatment.    Permanent Permanent atrial fibrillation does not go away. Episodes of atrial fibrillation may lead to permanent atrial fibrillation.   Atrial fibrillation can prevent your heart from pumping blood normally. It increases your risk of stroke and can lead to heart failure.   CAUSES    Heart conditions, including a heart attack, heart failure, coronary artery disease, and heart valve conditions.    Inflammation of the sac that surrounds the heart (pericarditis).    Blockage of an artery in the lungs (pulmonary embolism).    Pneumonia or other infections.    Chronic lung disease.    Thyroid problems, especially if the thyroid is overactive (hyperthyroidism).    Caffeine, excessive alcohol  use, and use of some illegal drugs.    Use of some medications, including certain decongestants and diet pills.    Heart surgery.    Birth defects.   Sometimes, no cause can be found. When this happens, the atrial fibrillation is called lone atrial fibrillation. The risk of complications from atrial fibrillation increases if you have lone atrial fibrillation and you are age 60 years or older.  RISK FACTORS   Heart failure.   Coronary artery disease   Diabetes mellitus.    High blood pressure (hypertension).    Obesity.    Other arrhythmias.    Increased age.  SYMPTOMS    A feeling that your heart is beating rapidly or irregularly.    A feeling of discomfort or pain in your chest.    Shortness of breath.    Sudden lightheadedness or weakness.    Getting tired easily when exercising.    Urinating more often than normal (mainly when atrial fibrillation first begins).   In paroxysmal atrial fibrillation, symptoms may start and suddenly stop.  DIAGNOSIS   Your caregiver may be able to detect atrial fibrillation when taking your pulse. Usually, testing is needed to diagnosis atrial fibrillation. Tests may include:    Electrocardiography. During this test, the electrical impulses of your heart are recorded while you are lying down.    Echocardiography. During echocardiography, sound waves are used to evaluate how blood flows through your heart.    Stress test. There is more than one type of stress test. If a stress test is   needed, ask your caregiver about which type is best for you.    Chest X-ray exam.    Blood tests.    Computed tomography (CT).   TREATMENT    Treating any underlying conditions. For example, if you have an overactive thyroid, treating the condition may correct atrial fibrillation.    Medication. Medications may be given to control a rapid heart rate or to prevent blood clots, heart failure, or a stroke.    Procedure to correct the rhythm of the  heart:   Electrical cardioversion. During electrical cardioversion, a controlled, low-energy shock is delivered to the heart through your skin. If you have chest pain, very low pressure blood pressure, or sudden heart failure, this procedure may need to be done as an emergency.   Catheter ablation. During this procedure, heart tissues that send the signals that cause atrial fibrillation are destroyed.   Maze or minimaze procedure. During this surgery, thin lines of heart tissue that carry the abnormal signals are destroyed. The maze procedure is an open-heart surgery. The minimaze procedure is a minimally invasive surgery. This means that small cuts are made to access the heart instead of a large opening.   Pulmonary venous isolation. During this surgery, tissue around the veins that carry blood from the lungs (pulmonary veins) is destroyed. This tissue is thought to carry the abnormal signals.  HOME CARE INSTRUCTIONS    Take medications as directed by your caregiver.   Only take medications that your caregiver approves. Some medications can make atrial fibrillation worse or recur.   If blood thinners were prescribed by your caregiver, take them exactly as directed. Too much can cause bleeding. Too little and you will not have the needed protection against stroke and other problems.   Perform blood tests at home if directed by your caregiver.   Perform blood tests exactly as directed.    Quit smoking if you smoke.    Do not drink alcohol.    Do not drink caffeinated beverages such as coffee, soda, and some teas. You may drink decaffeinated coffee, soda, or tea.    Maintain a healthy weight. Do not use diet pills unless your caregiver approves. They may make heart problems worse.    Follow diet instructions as directed by your caregiver.    Exercise regularly as directed by your caregiver.    Keep all follow-up appointments.  PREVENTION   The following substances can cause atrial fibrillation  to recur:    Caffeinated beverages.    Alcohol.    Certain medications, especially those used for breathing problems.    Certain herbs and herbal medications, such as those containing ephedra or ginseng.   Illegal drugs such as cocaine and amphetamines.  Sometimes medications are given to prevent atrial fibrillation from recurring. Proper treatment of any underlying condition is also important in helping prevent recurrence.   SEEK MEDICAL CARE IF:   You notice a change in the rate, rhythm, or strength of your heartbeat.    You suddenly begin urinating more frequently.    You tire more easily when exerting yourself or exercising.   SEEK IMMEDIATE MEDICAL CARE IF:    You develop chest pain, abdominal pain, sweating, or weakness.   You feel sick to your stomach (nauseous).   You develop shortness of breath.   You suddenly develop swollen feet and ankles.   You feel dizzy.   You face or limbs feel numb or weak.   There is a change in your   vision or speech.  MAKE SURE YOU:    Understand these instructions.   Will watch your condition.   Will get help right away if you are not doing well or get worse.  Document Released: 10/08/2005 Document Revised: 02/02/2013 Document Reviewed: 11/18/2012  [employer] Patient Information 2014 [employer]

## 2014-02-17 NOTE — ED Notes (Signed)
MD and PA made aware that pt is reporting dizziness and nausea. PA now at bedside

## 2014-02-17 NOTE — Sedation Documentation (Signed)
Shock delivered by Junie Panning,  PA at 100j with Dr. Alvino Chapel at bedside. Pt tolerated well.

## 2014-02-18 ENCOUNTER — Ambulatory Visit (INDEPENDENT_AMBULATORY_CARE_PROVIDER_SITE_OTHER): Payer: BC Managed Care – PPO | Admitting: Emergency Medicine

## 2014-02-18 VITALS — BP 134/90 | HR 67 | Temp 98.3°F | Resp 14 | Ht <= 58 in | Wt 188.0 lb

## 2014-02-18 DIAGNOSIS — H8110 Benign paroxysmal vertigo, unspecified ear: Secondary | ICD-10-CM

## 2014-02-18 DIAGNOSIS — I4891 Unspecified atrial fibrillation: Secondary | ICD-10-CM

## 2014-02-18 MED ORDER — DIAZEPAM 2 MG PO TABS: 2.0000 mg | ORAL_TABLET | Freq: Four times a day (QID) | ORAL | Status: DC | PRN

## 2014-02-18 NOTE — Patient Instructions (Signed)
Atrial Fibrillation Atrial fibrillation is a type of irregular heart rhythm (arrhythmia). During atrial fibrillation, the upper chambers of the heart (atria) quiver continuously in a chaotic pattern. This causes an irregular and often rapid heart rate.  Atrial fibrillation is the result of the heart becoming overloaded with disorganized signals that tell it to beat. These signals are normally released one at a time by a part of the right atrium called the sinoatrial node. They then travel from the atria to the lower chambers of the heart (ventricles), causing the atria and ventricles to contract and pump blood as they pass. In atrial fibrillation, parts of the atria outside of the sinoatrial node also release these signals. This results in two problems. First, the atria receive so many signals that they do not have time to fully contract. Second, the ventricles, which can only receive one signal at a time, beat irregularly and out of rhythm with the atria.  There are three types of atrial fibrillation:   Paroxysmal Paroxysmal atrial fibrillation starts suddenly and stops on its own within a week.   Persistent Persistent atrial fibrillation lasts for more than a week. It may stop on its own or with treatment.   Permanent Permanent atrial fibrillation does not go away. Episodes of atrial fibrillation may lead to permanent atrial fibrillation.  Atrial fibrillation can prevent your heart from pumping blood normally. It increases your risk of stroke and can lead to heart failure.  CAUSES   Heart conditions, including a heart attack, heart failure, coronary artery disease, and heart valve conditions.   Inflammation of the sac that surrounds the heart (pericarditis).   Blockage of an artery in the lungs (pulmonary embolism).   Pneumonia or other infections.   Chronic lung disease.   Thyroid problems, especially if the thyroid is overactive (hyperthyroidism).   Caffeine, excessive alcohol  use, and use of some illegal drugs.   Use of some medications, including certain decongestants and diet pills.   Heart surgery.   Birth defects.  Sometimes, no cause can be found. When this happens, the atrial fibrillation is called lone atrial fibrillation. The risk of complications from atrial fibrillation increases if you have lone atrial fibrillation and you are age 46 years or older. RISK FACTORS  Heart failure.  Coronary artery disease  Diabetes mellitus.   High blood pressure (hypertension).   Obesity.   Other arrhythmias.   Increased age. SYMPTOMS   A feeling that your heart is beating rapidly or irregularly.   A feeling of discomfort or pain in your chest.   Shortness of breath.   Sudden lightheadedness or weakness.   Getting tired easily when exercising.   Urinating more often than normal (mainly when atrial fibrillation first begins).  In paroxysmal atrial fibrillation, symptoms may start and suddenly stop. DIAGNOSIS  Your caregiver may be able to detect atrial fibrillation when taking your pulse. Usually, testing is needed to diagnosis atrial fibrillation. Tests may include:   Electrocardiography. During this test, the electrical impulses of your heart are recorded while you are lying down.   Echocardiography. During echocardiography, sound waves are used to evaluate how blood flows through your heart.   Stress test. There is more than one type of stress test. If a stress test is needed, ask your caregiver about which type is best for you.   Chest X-ray exam.   Blood tests.   Computed tomography (CT).  TREATMENT   Treating any underlying conditions. For example, if you have an overactive  thyroid, treating the condition may correct atrial fibrillation.   Medication. Medications may be given to control a rapid heart rate or to prevent blood clots, heart failure, or a stroke.   Procedure to correct the rhythm of the  heart:  Electrical cardioversion. During electrical cardioversion, a controlled, low-energy shock is delivered to the heart through your skin. If you have chest pain, very low pressure blood pressure, or sudden heart failure, this procedure may need to be done as an emergency.  Catheter ablation. During this procedure, heart tissues that send the signals that cause atrial fibrillation are destroyed.  Maze or minimaze procedure. During this surgery, thin lines of heart tissue that carry the abnormal signals are destroyed. The maze procedure is an open-heart surgery. The minimaze procedure is a minimally invasive surgery. This means that small cuts are made to access the heart instead of a large opening.  Pulmonary venous isolation. During this surgery, tissue around the veins that carry blood from the lungs (pulmonary veins) is destroyed. This tissue is thought to carry the abnormal signals. HOME CARE INSTRUCTIONS   Take medications as directed by your caregiver.  Only take medications that your caregiver approves. Some medications can make atrial fibrillation worse or recur.  If blood thinners were prescribed by your caregiver, take them exactly as directed. Too much can cause bleeding. Too little and you will not have the needed protection against stroke and other problems.  Perform blood tests at home if directed by your caregiver.  Perform blood tests exactly as directed.   Quit smoking if you smoke.   Do not drink alcohol.   Do not drink caffeinated beverages such as coffee, soda, and some teas. You may drink decaffeinated coffee, soda, or tea.   Maintain a healthy weight. Do not use diet pills unless your caregiver approves. They may make heart problems worse.   Follow diet instructions as directed by your caregiver.   Exercise regularly as directed by your caregiver.   Keep all follow-up appointments. PREVENTION  The following substances can cause atrial fibrillation  to recur:   Caffeinated beverages.   Alcohol.   Certain medications, especially those used for breathing problems.   Certain herbs and herbal medications, such as those containing ephedra or ginseng.  Illegal drugs such as cocaine and amphetamines. Sometimes medications are given to prevent atrial fibrillation from recurring. Proper treatment of any underlying condition is also important in helping prevent recurrence.  SEEK MEDICAL CARE IF:  You notice a change in the rate, rhythm, or strength of your heartbeat.   You suddenly begin urinating more frequently.   You tire more easily when exerting yourself or exercising.  SEEK IMMEDIATE MEDICAL CARE IF:   You develop chest pain, abdominal pain, sweating, or weakness.  You feel sick to your stomach (nauseous).  You develop shortness of breath.  You suddenly develop swollen feet and ankles.  You feel dizzy.  You face or limbs feel numb or weak.  There is a change in your vision or speech. MAKE SURE YOU:   Understand these instructions.  Will watch your condition.  Will get help right away if you are not doing well or get worse. Document Released: 10/08/2005 Document Revised: 02/02/2013 Document Reviewed: 11/18/2012 [facility] Patient Information 2014 [place]. Benign Positional Vertigo Vertigo means you feel like you or your surroundings are moving when they are not. Benign positional vertigo is the most common form of vertigo. Benign means that the cause of your condition is not  serious. Benign positional vertigo is more common in older adults. CAUSES  Benign positional vertigo is the result of an upset in the labyrinth system. This is an area in the middle ear that helps control your balance. This may be caused by a viral infection, head injury, or repetitive motion. However, often no specific cause is found. SYMPTOMS  Symptoms of benign positional vertigo occur when you move your head or eyes in different  directions. Some of the symptoms may include:  Loss of balance and falls.  Vomiting.  Blurred vision.  Dizziness.  Nausea.  Involuntary eye movements (nystagmus). DIAGNOSIS  Benign positional vertigo is usually diagnosed by physical exam. If the specific cause of your benign positional vertigo is unknown, your caregiver may perform imaging tests, such as magnetic resonance imaging (MRI) or computed tomography (CT). TREATMENT  Your caregiver may recommend movements or procedures to correct the benign positional vertigo. Medicines such as meclizine, benzodiazepines, and medicines for nausea may be used to treat your symptoms. In rare cases, if your symptoms are caused by certain conditions that affect the inner ear, you may need surgery. HOME CARE INSTRUCTIONS   Follow your caregiver's instructions.  Move slowly. Do not make sudden body or head movements.  Avoid driving.  Avoid operating heavy machinery.  Avoid performing any tasks that would be dangerous to you or others during a vertigo episode.  Drink enough fluids to keep your urine clear or pale yellow. SEEK IMMEDIATE MEDICAL CARE IF:   You develop problems with walking, weakness, numbness, or using your arms, hands, or legs.  You have difficulty speaking.  You develop severe headaches.  Your nausea or vomiting continues or gets worse.  You develop visual changes.  Your family or friends notice any behavioral changes.  Your condition gets worse.  You have a fever.  You develop a stiff neck or sensitivity to light. MAKE SURE YOU:   Understand these instructions.  Will watch your condition.  Will get help right away if you are not doing well or get worse. Document Released: 07/16/2006 Document Revised: 12/31/2011 Document Reviewed: 06/28/2011 [facility] Patient Information 2014 [place].

## 2014-02-18 NOTE — Progress Notes (Signed)
[facility] [address], [place] [address] [phone]  Date:  02/18/2014   Name:  John Odonnell   DOB:  03/14/1966   MRN:  [id number]  PCP:  No PCP Per Patient    Chief Complaint: Follow-up   History of Present Illness:  John Odonnell is a 48 y.o. very pleasant male patient who presents with the following:  Wednesday at 0400 awoke with sudden onset of vertigo and vomiting.  Called 911 and found to be in AF with a rapid ventricular response.  In er failed cardizem infusion and was subsequently cardioverted.  Has persistent vertigo that has improved with medication but is still problematic on antivert.  No further arrhythmia.  Had negative MRI brain following cardioversion.  Has persistent tinnitus in right ear.  No chest pain, tightness or pressure. No other neuro or visual symptoms.  No improvement with over the counter medications or other home remedies. Denies other complaint or health concern today.   Patient Active Problem List   Diagnosis Date Noted  . Hyperlipidemia 02/23/2012  . Blood pressure elevated without history of HTN 02/23/2012  . Allergic rhinitis 02/23/2012  . Family history of melanoma 02/23/2012    Past Medical History  Diagnosis Date  . Hyperlipidemia     Past Surgical History  Procedure Laterality Date  . Tonsillectomy and adenoidectomy    . Anterior cruciate ligament repair  1995    Left knee  . Hernia repair      History  Substance Use Topics  . Smoking status: Never Smoker   . Smokeless tobacco: Not on file  . Alcohol Use: Yes    Family History  Problem Relation Age of Onset  . Depression Mother   . Hypertension Mother   . Depression Sister   . Depression Brother   . Mental illness Maternal Grandmother   . Heart disease Paternal Grandfather     No Known Allergies  Medication list has been reviewed and updated.  Current Outpatient Prescriptions on File Prior to Visit  Medication Sig Dispense Refill  .  meclizine (ANTIVERT) 12.5 MG tablet Take 1 tablet (12.5 mg total) by mouth 3 (three) times daily as needed for dizziness.  30 tablet  0  . promethazine (PHENERGAN) 25 MG tablet Take 1 tablet (25 mg total) by mouth every 6 (six) hours as needed for nausea or vomiting.  12 tablet  0   No current facility-administered medications on file prior to visit.    Review of Systems:  As per HPI, otherwise negative.    Physical Examination: Filed Vitals:   02/18/14 1118  BP: 134/90  Pulse: 67  Temp: 98.3 F (36.8 C)  Resp: 14   Filed Vitals:   02/18/14 1118  Height: 6" (0.152 m)  Weight: 188 lb (85.276 kg)   Body mass index is 3,690.96 kg/(m^2). Ideal Body Weight: Weight in (lb) to have BMI = 25: 1.3  GEN: WDWN, NAD, Non-toxic, A & O x 3 HEENT: Atraumatic, Normocephalic. Neck supple. No masses, No LAD. Ears and Nose: No external deformity. CV: RRR, No M/G/R. No JVD. No thrill. No extra heart sounds. PULM: CTA B, no wheezes, crackles, rhonchi. No retractions. No resp. distress. No accessory muscle use. ABD: S, NT, ND, +BS. No rebound. No HSM. EXTR: No c/c/e NEURO Normal gait. Normal CN 2-12, PRRERLA EOMI romberg impaired.  Intact tandem gait PSYCH: Normally interactive. Conversant. Not depressed or anxious appearing.  Calm demeanor.    Assessment and Plan:  New AF BPV Dr Nadyne Coombes ENT  Signed,  Ellison Carwin, MD

## 2014-02-19 NOTE — ED Provider Notes (Signed)
  Physical Exam  BP 136/94  Pulse 72  Resp 13  SpO2 95%  Physical Exam  ED Course  CARDIOVERSION Date/Time: 02/17/2014 7:06 AM Performed by: Davonna Belling R. Authorized by: Mackie Pai Consent: Verbal consent obtained. written consent obtained. Risks and benefits: risks, benefits and alternatives were discussed Consent given by: patient and guardian Patient understanding: patient states understanding of the procedure being performed Patient consent: the patient's understanding of the procedure matches consent given Procedure consent: procedure consent matches procedure scheduled Relevant documents: relevant documents present and verified Test results: test results available and properly labeled Site marked: the operative site was marked Imaging studies: imaging studies available Required items: required blood products, implants, devices, and special equipment available Patient identity confirmed: verbally with patient and arm band Time out: Immediately prior to procedure a "time out" was called to verify the correct patient, procedure, equipment, support staff and site/side marked as required. Patient sedated: yes Sedation type: moderate (conscious) sedation Sedatives: etomidate Analgesia: 5 minutes of sedation. Vitals: Vital signs were monitored during sedation. Cardioversion basis: elective Pre-procedure rhythm: atrial fibrillation Patient position: patient was placed in a supine position Chest area: chest area exposed Electrodes: pads Electrodes placed: anterior-posterior Number of attempts: 1 Attempt 1 mode: synchronous Attempt 1 waveform: biphasic Attempt 1 shock (in Joules): 100 Attempt 1 outcome: conversion to normal sinus rhythm Post-procedure rhythm: normal sinus rhythm Complications: no complications Patient tolerance: Patient tolerated the procedure well with no immediate complications.    MDM CRITICAL CARE Performed by: Jasper Riling.  Debrah Granderson Total critical care time: 30 Critical care time was exclusive of separately billable procedures and treating other patients. Critical care was necessary to treat or prevent imminent or life-threatening deterioration. Critical care was time spent personally by me on the following activities: development of treatment plan with patient and/or surrogate as well as nursing, discussions with consultants, evaluation of patient's response to treatment, examination of patient, obtaining history from patient or surrogate, ordering and performing treatments and interventions, ordering and review of laboratory studies, ordering and review of radiographic studies, pulse oximetry and re-evaluation of patient's condition.  Patient with atrial fibrillation with RVR. Patient had boluses and drip of Cardizem without relief. Had a firm onset around 3 hours prior to arrival. Discussed with cardiology and patient was electrically cardioverted in the ED. He then had continuation of his vertigo. MRI done and did not show stroke. He is doing somewhat better and discharged home      Jasper Riling. Alvino Chapel, [place] 02/19/14 [phone]

## 2014-02-19 NOTE — ED Provider Notes (Signed)
Medical screening examination/treatment/procedure(s) were conducted as a shared visit with non-physician practitioner(s) and myself.  I personally evaluated the patient during the encounter.   EKG Interpretation None     Patient with atrial fibrillation with RVR. Acute onset earlier in the same day. Cardioverted with resolution of A. fib and return of sinus rhythm. Patient continued to have some vertigo. MRI done and does not show infarct. Patient feels somewhat better but discharged home will follow with cardiology  Jasper Riling. Alvino Chapel, [place] 02/19/14 [phone]

## 2014-03-17 ENCOUNTER — Encounter: Payer: BC Managed Care – PPO | Admitting: Cardiology

## 2014-10-22 DIAGNOSIS — I4891 Unspecified atrial fibrillation: Secondary | ICD-10-CM

## 2014-10-22 HISTORY — DX: Unspecified atrial fibrillation: I48.91

## 2015-03-08 ENCOUNTER — Ambulatory Visit (INDEPENDENT_AMBULATORY_CARE_PROVIDER_SITE_OTHER): Payer: BLUE CROSS/BLUE SHIELD

## 2015-03-08 ENCOUNTER — Ambulatory Visit (INDEPENDENT_AMBULATORY_CARE_PROVIDER_SITE_OTHER): Payer: BLUE CROSS/BLUE SHIELD | Admitting: Emergency Medicine

## 2015-03-08 VITALS — HR 55 | Temp 97.6°F | Resp 16 | Ht 72.0 in | Wt 192.6 lb

## 2015-03-08 DIAGNOSIS — S20212A Contusion of left front wall of thorax, initial encounter: Secondary | ICD-10-CM

## 2015-03-08 LAB — POCT URINALYSIS DIPSTICK
Bilirubin, UA: NEGATIVE
Blood, UA: NEGATIVE
GLUCOSE UA: NEGATIVE
Ketones, UA: NEGATIVE
LEUKOCYTES UA: NEGATIVE
Nitrite, UA: NEGATIVE
Protein, UA: NEGATIVE
SPEC GRAV UA: 1.015
UROBILINOGEN UA: 0.2
pH, UA: 7

## 2015-03-08 LAB — POCT UA - MICROSCOPIC ONLY
Bacteria, U Microscopic: NEGATIVE
Casts, Ur, LPF, POC: NEGATIVE
Crystals, Ur, HPF, POC: NEGATIVE
Epithelial cells, urine per micros: NEGATIVE
MUCUS UA: NEGATIVE
RBC, URINE, MICROSCOPIC: NEGATIVE
WBC, UR, HPF, POC: NEGATIVE
YEAST UA: NEGATIVE

## 2015-03-08 MED ORDER — HYDROCODONE-ACETAMINOPHEN 5-325 MG PO TABS: 1.0000 | ORAL_TABLET | ORAL | Status: DC | PRN

## 2015-03-08 NOTE — Patient Instructions (Signed)

## 2015-03-08 NOTE — Progress Notes (Signed)
Subjective:  Patient ID: John Odonnell, male    DOB: 1965/11/07  Age: 49 y.o. MRN: [id number]  CC: Back Injury and Spasms   HPI John Odonnell presents for injury to his left left chest while doing yard work on Saturday. He tripped over a bale of pine straw straw and fell against a tree.  Since the the time of his injuries he experienced chest pain with deep inspiration and coughing in the left side of his chest. There is no hemoptysis or shortness of breath. He has no nausea or vomiting and no blood in his urine. No improvement with over the counter medications or other home remedies. Denies other complaint or health concern today.   Outpatient Prescriptions Prior to Visit  Medication Sig Dispense Refill  . diazepam (VALIUM) 2 MG tablet Take 1 tablet (2 mg total) by mouth every 6 (six) hours as needed for anxiety. (Patient not taking: Reported on 03/08/2015) 30 tablet 0  . meclizine (ANTIVERT) 12.5 MG tablet Take 1 tablet (12.5 mg total) by mouth 3 (three) times daily as needed for dizziness. (Patient not taking: Reported on 03/08/2015) 30 tablet 0  . promethazine (PHENERGAN) 25 MG tablet Take 1 tablet (25 mg total) by mouth every 6 (six) hours as needed for nausea or vomiting. (Patient not taking: Reported on 03/08/2015) 12 tablet 0   No facility-administered medications prior to visit.    ROS Review of Systems   Constitutional: Negative for fever, chills, diaphoresis, activity change, appetite change, fatigue and unexpected weight change.  HENT: Negative for hearing loss, congestion, sore throat, rhinorrhea, sneezing, trouble swallowing, neck pain, dental problem, voice change, postnasal drip and tinnitus.  Eyes: Negative for photophobia, pain, discharge, redness and visual disturbance.  Respiratory: Negative for cough, chest tightness, shortness of breath and wheezing.  Cardiovascular: Negative for chest pain, palpitations and leg swelling.  Gastrointestinal: Negative for nausea,  abdominal pain, diarrhea, constipation and blood in stool.  Endocrine: Negative for cold intolerance, heat intolerance and polyuria.  Genitourinary: Negative for dysuria, urgency, frequency, hematuria, flank pain, vaginal bleeding, vaginal discharge, difficulty urinating, genital sores, menstrual problem and pelvic pain.  Musculoskeletal: Negative for back pain and gait problem.  Neurological: Negative for dizziness, weakness and headaches.  Hematological: Negative for adenopathy. Does not bruise/bleed easily.  Psychiatric/Behavioral: Negative for sleep disturbance, self-injury and dysphoric mood. The patient is not nervous/anxious.  currently on menses which seem normal/    Objective:  Pulse 55  Temp(Src) 97.6 F (36.4 C) (Oral)  Resp 16  Ht 6' (1.829 m)  Wt 192 lb 9.6 oz (87.363 kg)  BMI 26.12 kg/m2  SpO2 98%  BP Readings from Last 3 Encounters:  02/18/14 134/90  02/17/14 136/94  07/24/13 138/90    Wt Readings from Last 3 Encounters:  03/08/15 192 lb 9.6 oz (87.363 kg)  02/18/14 188 lb (85.276 kg)  07/24/13 176 lb (79.833 kg)    Physical Exam   GEN: WDWN, NAD, Non-toxic, A & O x 3 HEENT: Atraumatic, Normocephalic. Neck supple. No masses, No LAD. Ears and Nose: No external deformity. CV: RRR, No M/G/R. No JVD. No thrill. No extra heart sounds. PULM: CTA B, no wheezes, crackles, rhonchi. No retractions. No resp. distress. No accessory muscle use. There is no ecchymosis or flail chest. He has equal breath sounds and tenderness in left posterior chest wall ABD: S, NT, ND, +BS. No rebound. No HSM. EXTR: No c/c/e NEURO Normal gait.  PSYCH: Normally interactive. Conversant. Not depressed or anxious appearing.  Calm  demeanor.    Results for orders placed or performed in visit on 03/08/15  POCT UA - Microscopic Only  Result Value Ref Range   WBC, Ur, HPF, POC neg    RBC, urine, microscopic neg    Bacteria, U Microscopic neg    Mucus, UA neg    Epithelial cells, urine per  micros neg    Crystals, Ur, HPF, POC neg    Casts, Ur, LPF, POC neg    Yeast, UA neg   POCT urinalysis dipstick  Result Value Ref Range   Color, UA yellow    Clarity, UA clear    Glucose, UA neg    Bilirubin, UA neg    Ketones, UA neg    Spec Grav, UA 1.015    Blood, UA neg    pH, UA 7.0    Protein, UA neg    Urobilinogen, UA 0.2    Nitrite, UA neg    Leukocytes, UA Negative      As there is no evidence of hematuria is unlikely suffered a renal contusion [facility] reading (PRIMARY) by  Dr. Ouida Sills. Normal chest with no evidence of pneumothorax hemothorax.      Assessment & Plan:   John Odonnell was seen today for back injury and spasms.  Diagnoses and all orders for this visit:  Chest wall contusion, left, initial encounter Orders: -     POCT UA - Microscopic Only -     POCT urinalysis dipstick -     DG Chest 2 View; Future  Other orders -     HYDROcodone-acetaminophen (NORCO) 5-325 MG per tablet; Take 1-2 tablets by mouth every 4 (four) hours as needed.   I am having John Odonnell start on HYDROcodone-acetaminophen. I am also having him maintain his meclizine, promethazine, diazepam, valsartan, metoprolol succinate, and aspirin.  Meds ordered this encounter  Medications  . valsartan (DIOVAN) 320 MG tablet    Sig: Take 320 mg by mouth daily.  . metoprolol succinate (TOPROL-XL) 50 MG 24 hr tablet    Sig: Take 50 mg by mouth daily. Take with or immediately following a meal.  . aspirin 81 MG tablet    Sig: Take 81 mg by mouth daily.  Marland Kitchen HYDROcodone-acetaminophen (NORCO) 5-325 MG per tablet    Sig: Take 1-2 tablets by mouth every 4 (four) hours as needed.    Dispense:  30 tablet    Refill:  0   Appropriate red flags and actions were discussed with the patient who understands.   Follow-up: Return in about 1 week (around 03/15/2015).  Roselee Culver, MD

## 2015-03-13 ENCOUNTER — Telehealth: Payer: Self-pay

## 2015-03-13 NOTE — Telephone Encounter (Signed)
Pt is requesting a refill of hydrocodone. He is having trouble sleeping because the pain.

## 2015-03-14 NOTE — Telephone Encounter (Signed)
No must come in

## 2015-03-15 NOTE — Telephone Encounter (Signed)
Left a detailed message letting pt know he has to come in.

## 2015-09-27 ENCOUNTER — Telehealth: Payer: Self-pay

## 2015-09-27 NOTE — Telephone Encounter (Signed)
PATIENT STATES HE WAS REFERRED TO SEE A CARDIOLOGIST (DR. Einar Gip) BY DR. Ouida Sills OVER A YEAR AGO. HE HAS BEEN RELEASED BY DR. Einar Gip IN June OF 2016. NOW HE NEEDS TO GET REFILLS ON VALSARTAN 320/HCTZ 25 MG AND THE CARDIOLOGIST TOLD HIM THAT HIS PCP NEEDS TO TAKE OVER HIS BLOOD PRESSURE CARE NOW. HE ONLY NEEDS TO RETURN BACK TO DR. GANJI AS NEEDED. HE WOULD LIKE TO KNOW WHAT HE HAS TO DO TO GET HIS MEDICATION REFILLED? BEST PHONE [phone]   PHARMACY CHOICE IS [facility] ON [address]  [facility]

## 2015-09-28 NOTE — Telephone Encounter (Signed)
Pt.notified

## 2015-09-28 NOTE — Telephone Encounter (Signed)
Needs to follow up here for BP check and labwork.  We will manage BP

## 2016-04-17 IMAGING — CR DG CHEST 1V PORT
1 series · 1 of 1 positions shown · non-contrast
Comparison: 03/17/2009

CLINICAL DATA: Atrial fibrillation.

EXAM:
PORTABLE CHEST - 1 VIEW

[AP]
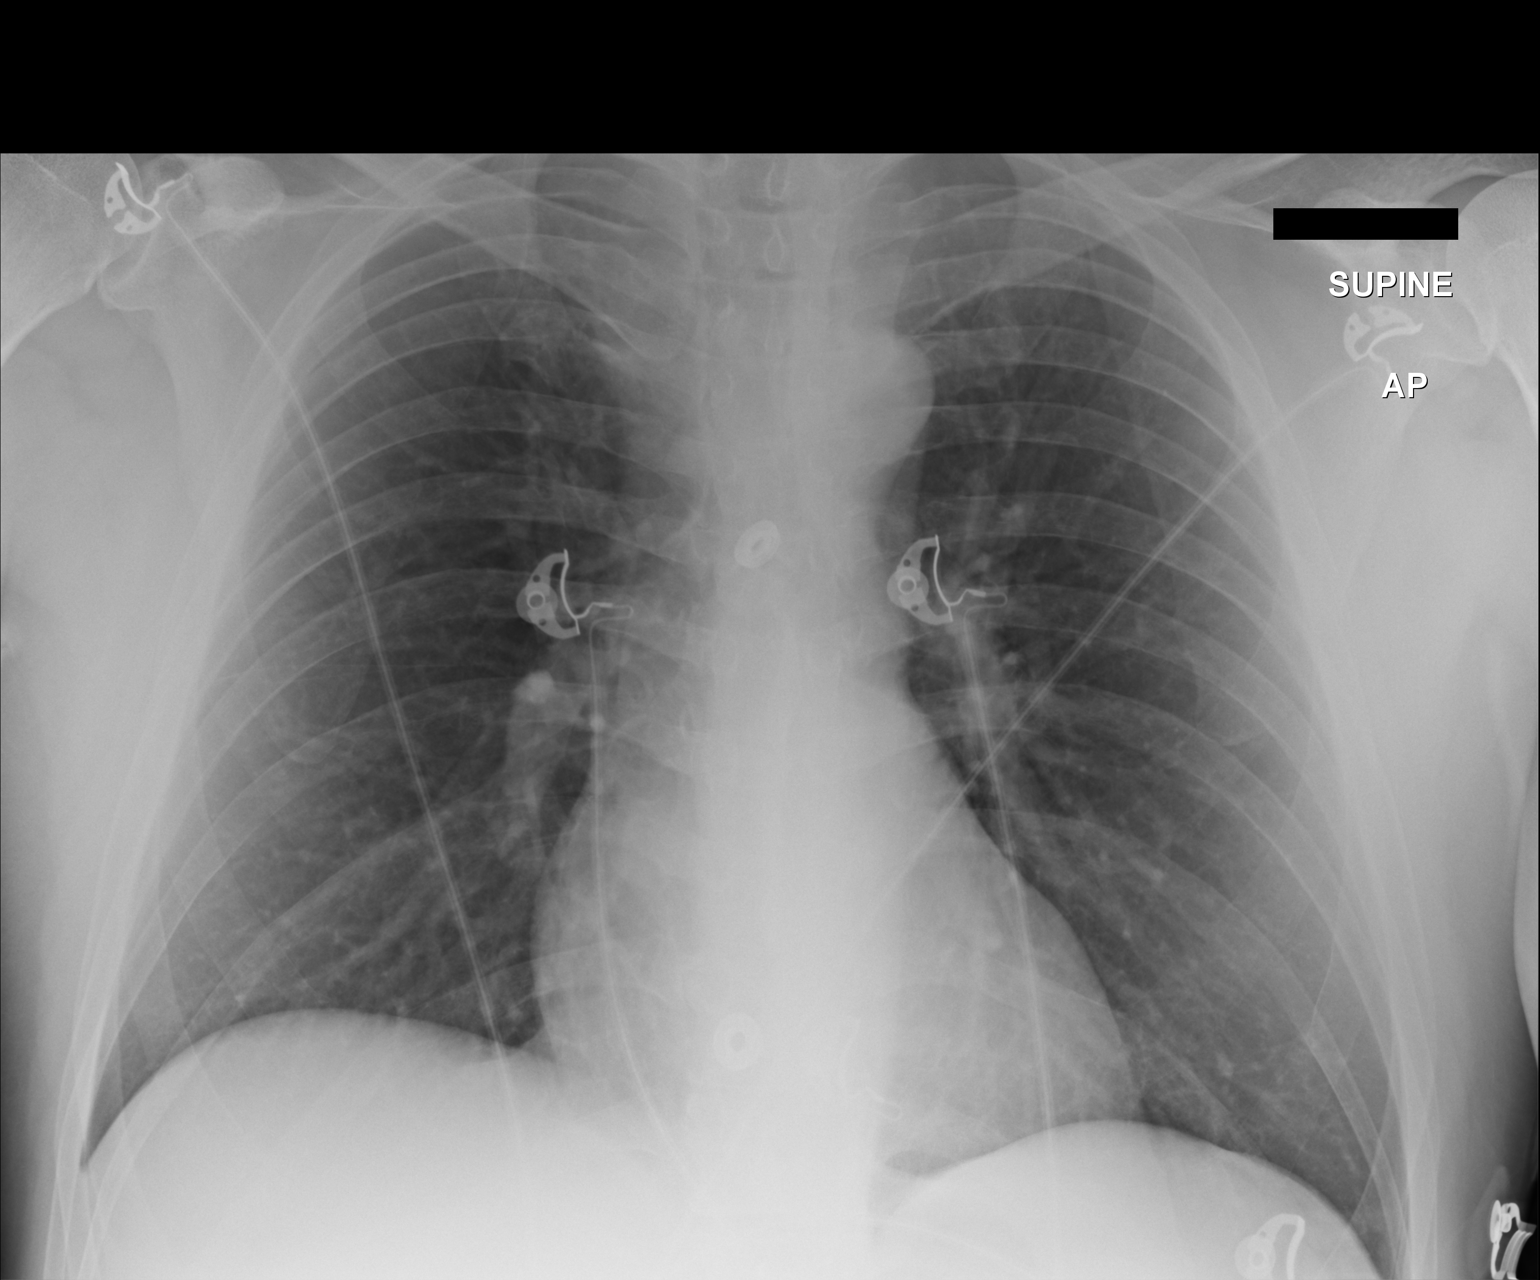

[1 of 1 positions shown; findings below may reference images not displayed]

FINDINGS: The cardiomediastinal silhouette is within normal limits. The lungs
are well inflated and clear. There is no evidence of pleural
effusion or pneumothorax. No acute osseous abnormality is
identified.
IMPRESSION: No active disease.

## 2018-05-06 ENCOUNTER — Encounter: Payer: Self-pay | Admitting: Family Medicine

## 2018-05-09 ENCOUNTER — Encounter: Payer: Self-pay | Admitting: Gastroenterology

## 2018-05-29 ENCOUNTER — Other Ambulatory Visit: Payer: Self-pay

## 2018-05-29 DIAGNOSIS — I10 Essential (primary) hypertension: Secondary | ICD-10-CM

## 2018-05-29 DIAGNOSIS — I4891 Unspecified atrial fibrillation: Secondary | ICD-10-CM

## 2018-06-11 ENCOUNTER — Ambulatory Visit (HOSPITAL_COMMUNITY): Payer: BLUE CROSS/BLUE SHIELD | Attending: Cardiovascular Disease

## 2018-06-11 ENCOUNTER — Other Ambulatory Visit: Payer: Self-pay

## 2018-06-11 DIAGNOSIS — I10 Essential (primary) hypertension: Secondary | ICD-10-CM | POA: Diagnosis not present

## 2018-06-11 DIAGNOSIS — I4891 Unspecified atrial fibrillation: Secondary | ICD-10-CM | POA: Insufficient documentation

## 2018-06-11 DIAGNOSIS — E785 Hyperlipidemia, unspecified: Secondary | ICD-10-CM | POA: Insufficient documentation

## 2018-06-11 DIAGNOSIS — I119 Hypertensive heart disease without heart failure: Secondary | ICD-10-CM | POA: Diagnosis not present

## 2018-06-13 ENCOUNTER — Encounter: Payer: Self-pay | Admitting: Cardiology

## 2018-06-25 ENCOUNTER — Encounter: Payer: Self-pay | Admitting: Gastroenterology

## 2018-06-25 ENCOUNTER — Ambulatory Visit (AMBULATORY_SURGERY_CENTER): Payer: Self-pay

## 2018-06-25 VITALS — Ht 72.0 in | Wt 204.4 lb

## 2018-06-25 DIAGNOSIS — Z1211 Encounter for screening for malignant neoplasm of colon: Secondary | ICD-10-CM

## 2018-06-25 MED ORDER — NA SULFATE-K SULFATE-MG SULF 17.5-3.13-1.6 GM/177ML PO SOLN: 1.0000 | Freq: Once | ORAL | 0 refills | Status: AC

## 2018-06-25 NOTE — Progress Notes (Signed)
No egg or soy allergy known to patient  No issues with past sedation with any surgeries  or procedures, no intubation problems  No diet pills per patient No home 02 use per patient  No blood thinners per patient  Pt denies issues with constipation   A fib  2016 defibrillated back in rythmn EMMI video sent to pt's e mail

## 2018-07-09 ENCOUNTER — Encounter: Payer: Self-pay | Admitting: Gastroenterology

## 2018-07-09 ENCOUNTER — Ambulatory Visit (AMBULATORY_SURGERY_CENTER): Payer: BLUE CROSS/BLUE SHIELD | Admitting: Gastroenterology

## 2018-07-09 VITALS — BP 103/67 | HR 50 | Temp 98.4°F | Resp 14 | Ht 72.0 in | Wt 204.0 lb

## 2018-07-09 DIAGNOSIS — D125 Benign neoplasm of sigmoid colon: Secondary | ICD-10-CM | POA: Diagnosis not present

## 2018-07-09 DIAGNOSIS — D123 Benign neoplasm of transverse colon: Secondary | ICD-10-CM

## 2018-07-09 DIAGNOSIS — Z1211 Encounter for screening for malignant neoplasm of colon: Secondary | ICD-10-CM | POA: Diagnosis present

## 2018-07-09 DIAGNOSIS — K635 Polyp of colon: Secondary | ICD-10-CM

## 2018-07-09 DIAGNOSIS — K621 Rectal polyp: Secondary | ICD-10-CM

## 2018-07-09 DIAGNOSIS — D128 Benign neoplasm of rectum: Secondary | ICD-10-CM

## 2018-07-09 DIAGNOSIS — D127 Benign neoplasm of rectosigmoid junction: Secondary | ICD-10-CM

## 2018-07-09 DIAGNOSIS — D129 Benign neoplasm of anus and anal canal: Secondary | ICD-10-CM

## 2018-07-09 MED ORDER — SODIUM CHLORIDE 0.9 % IV SOLN: 500.0000 mL | Freq: Once | INTRAVENOUS | Status: DC

## 2018-07-09 NOTE — Patient Instructions (Signed)
Continue present medications. Please read handouts on polyps, diverticulosis,  and hemorrhoids.     YOU HAD AN ENDOSCOPIC PROCEDURE TODAY AT THE [facility]:   Refer to the procedure report that was given to you for any specific questions about what was found during the examination.  If the procedure report does not answer your questions, please call your gastroenterologist to clarify.  If you requested that your care partner not be given the details of your procedure findings, then the procedure report has been included in a sealed envelope for you to review at your convenience later.  YOU SHOULD EXPECT: Some feelings of bloating in the abdomen. Passage of more gas than usual.  Walking can help get rid of the air that was put into your GI tract during the procedure and reduce the bloating. If you had a lower endoscopy (such as a colonoscopy or flexible sigmoidoscopy) you may notice spotting of blood in your stool or on the toilet paper. If you underwent a bowel prep for your procedure, you may not have a normal bowel movement for a few days.  Please Note:  You might notice some irritation and congestion in your nose or some drainage.  This is from the oxygen used during your procedure.  There is no need for concern and it should clear up in a day or so.  SYMPTOMS TO REPORT IMMEDIATELY:   Following lower endoscopy (colonoscopy or flexible sigmoidoscopy):  Excessive amounts of blood in the stool  Significant tenderness or worsening of abdominal pains  Swelling of the abdomen that is new, acute  Fever of 100F or higher    For urgent or emergent issues, a gastroenterologist can be reached at any hour by calling [phone].   DIET:  We do recommend a small meal at first, but then you may proceed to your regular diet.  Drink plenty of fluids but you should avoid alcoholic beverages for 24 hours.  ACTIVITY:  You should plan to take it easy for the rest of today and you should  NOT DRIVE or use heavy machinery until tomorrow (because of the sedation medicines used during the test).    FOLLOW UP: Our staff will call the number listed on your records the next business day following your procedure to check on you and address any questions or concerns that you may have regarding the information given to you following your procedure. If we do not reach you, we will leave a message.  However, if you are feeling well and you are not experiencing any problems, there is no need to return our call.  We will assume that you have returned to your regular daily activities without incident.  If any biopsies were taken you will be contacted by phone or by letter within the next 1-3 weeks.  Please call us at [phone] if you have not heard about the biopsies in 3 weeks.    SIGNATURES/CONFIDENTIALITY: You and/or your care partner have signed paperwork which will be entered into your electronic medical record.  These signatures attest to the fact that that the information above on your After Visit Summary has been reviewed and is understood.  Full responsibility of the confidentiality of this discharge information lies with you and/or your care-partner. 

## 2018-07-09 NOTE — Op Note (Signed)
[facility] Patient Name: Fielding Mault Procedure Date: 07/09/2018 9:11 AM MRN: [id number] Endoscopist: Remo Lipps P. Havery Moros , MD Age: 52 Referring MD:  Date of Birth: Aug 29, 1966 Gender: Male Account #: [id number] Procedure:                Colonoscopy Indications:              Screening for colorectal malignant neoplasm, This                            is the patient's first colonoscopy Medicines:                Monitored Anesthesia Care Procedure:                Pre-Anesthesia Assessment:                           - Prior to the procedure, a History and Physical                            was performed, and patient medications and                            allergies were reviewed. The patient's tolerance of                            previous anesthesia was also reviewed. The risks                            and benefits of the procedure and the sedation                            options and risks were discussed with the patient.                            All questions were answered, and informed consent                            was obtained. Prior Anticoagulants: The patient has                            taken no previous anticoagulant or antiplatelet                            agents. ASA Grade Assessment: II - A patient with                            mild systemic disease. After reviewing the risks                            and benefits, the patient was deemed in                            satisfactory condition to undergo the procedure.  After obtaining informed consent, the colonoscope                            was passed under direct vision. Throughout the                            procedure, the patient's blood pressure, pulse, and                            oxygen saturations were monitored continuously. The                            Colonoscope was introduced through the anus and                            advanced to the the  cecum, identified by                            appendiceal orifice and ileocecal valve. The                            colonoscopy was performed without difficulty. The                            patient tolerated the procedure well. The quality                            of the bowel preparation was good. The ileocecal                            valve, appendiceal orifice, and rectum were                            photographed. Scope In: 9:19:17 AM Scope Out: 9:38:00 AM Scope Withdrawal Time: 0 hours 16 minutes 14 seconds  Total Procedure Duration: 0 hours 18 minutes 43 seconds  Findings:                 The perianal and digital rectal examinations were                            normal.                           Two sessile polyps were found in the hepatic                            flexure. The polyps were 3 to 4 mm in size. These                            polyps were removed with a cold snare. Resection                            and retrieval were complete.  A 5 mm polyp was found in the transverse colon. The                            polyp was sessile. The polyp was removed with a                            cold snare. Resection and retrieval were complete.                           A diminutive polyp was found in the sigmoid colon.                            The polyp was sessile. The polyp was removed with a                            cold snare. Resection and retrieval were complete.                           A diminutive polyp was found in the rectum. The                            polyp was sessile. The polyp was removed with a                            cold snare. Resection and retrieval were complete.                           A few small-mouthed diverticula were found in the                            sigmoid colon.                           Internal hemorrhoids were found during retroflexion.                           The exam was otherwise  without abnormality. Complications:            No immediate complications. Estimated blood loss:                            Minimal. Estimated Blood Loss:     Estimated blood loss was minimal. Impression:               - Two 3 to 4 mm polyps at the hepatic flexure,                            removed with a cold snare. Resected and retrieved.                           - One 5 mm polyp in the transverse colon, removed  with a cold snare. Resected and retrieved.                           - One diminutive polyp in the sigmoid colon,                            removed with a cold snare. Resected and retrieved.                           - One diminutive polyp in the rectum, removed with                            a cold snare. Resected and retrieved.                           - Diverticulosis in the sigmoid colon.                           - Internal hemorrhoids.                           - The examination was otherwise normal. Recommendation:           - Patient has a contact number available for                            emergencies. The signs and symptoms of potential                            delayed complications were discussed with the                            patient. Return to normal activities tomorrow.                            Written discharge instructions were provided to the                            patient.                           - Resume previous diet.                           - Continue present medications.                           - Await pathology results.                           - Repeat colonoscopy for surveillance based on                            pathology results. Remo Lipps P. Desiree Daise, MD 07/09/2018 9:42:40 AM This report has been signed electronically.

## 2018-07-09 NOTE — Progress Notes (Signed)
Called to room to assist during endoscopic procedure.  Patient ID and intended procedure confirmed with present staff. Received instructions for my participation in the procedure from the performing physician.  

## 2018-07-10 ENCOUNTER — Telehealth: Payer: Self-pay

## 2018-07-10 ENCOUNTER — Telehealth: Payer: Self-pay | Admitting: *Deleted

## 2018-07-10 NOTE — Telephone Encounter (Signed)
First follow up call attempt.  Voicemail with phone number identifier.  Message left to call if any questions or concerns. 

## 2018-07-10 NOTE — Telephone Encounter (Signed)
  Follow up Call-  Call back number 07/09/2018  Post procedure Call Back phone  # [phone]  Permission to leave phone message Yes  Some recent data might be hidden     Patient questions:  Do you have a fever, pain , or abdominal swelling? No. Pain Score  0 *  Have you tolerated food without any problems? Yes.    Have you been able to return to your normal activities? Yes.    Do you have any questions about your discharge instructions: Diet   No. Medications  No. Follow up visit  No.  Do you have questions or concerns about your Care? No.  Actions: * If pain score is 4 or above: No action needed, pain <4.  No problems noted per pt. maw

## 2020-08-06 ENCOUNTER — Ambulatory Visit: Payer: BC Managed Care – PPO | Attending: Internal Medicine

## 2020-08-06 DIAGNOSIS — Z23 Encounter for immunization: Secondary | ICD-10-CM

## 2020-08-06 NOTE — Progress Notes (Signed)
° °  Covid-19 Vaccination Clinic  Name:  John Odonnell    MRN: [id number] DOB: [birth year]  08/06/2020  John Odonnell was observed post Covid-19 immunization for 15 minutes without incident. He was provided with Vaccine Information Sheet and instruction to access the V-Safe system.   John Odonnell was instructed to call 911 with any severe reactions post vaccine:  Difficulty breathing   Swelling of face and throat   A fast heartbeat   A bad rash all over body   Dizziness and weakness

## 2021-05-01 ENCOUNTER — Ambulatory Visit: Payer: BC Managed Care – PPO

## 2021-10-13 ENCOUNTER — Encounter: Payer: Self-pay | Admitting: Gastroenterology

## 2021-12-06 ENCOUNTER — Encounter: Payer: 59 | Admitting: Gastroenterology

## 2022-01-11 ENCOUNTER — Encounter: Payer: Self-pay | Admitting: Gastroenterology

## 2022-03-05 ENCOUNTER — Encounter: Payer: Self-pay | Admitting: Gastroenterology

## 2022-03-05 ENCOUNTER — Ambulatory Visit (AMBULATORY_SURGERY_CENTER): Payer: 59 | Admitting: *Deleted

## 2022-03-05 VITALS — Ht 72.0 in | Wt 195.0 lb

## 2022-03-05 DIAGNOSIS — Z86010 Personal history of colonic polyps: Secondary | ICD-10-CM

## 2022-03-05 MED ORDER — NA SULFATE-K SULFATE-MG SULF 17.5-3.13-1.6 GM/177ML PO SOLN: 1.0000 | Freq: Once | ORAL | 0 refills | Status: AC

## 2022-03-05 NOTE — Progress Notes (Signed)

## 2022-03-23 ENCOUNTER — Encounter: Payer: Self-pay | Admitting: Gastroenterology

## 2022-03-26 ENCOUNTER — Encounter: Payer: Self-pay | Admitting: Gastroenterology

## 2022-03-26 ENCOUNTER — Ambulatory Visit (AMBULATORY_SURGERY_CENTER): Payer: 59 | Admitting: Gastroenterology

## 2022-03-26 VITALS — BP 118/80 | HR 57 | Temp 96.6°F | Resp 16 | Ht 72.0 in | Wt 195.0 lb

## 2022-03-26 DIAGNOSIS — Z86010 Personal history of colonic polyps: Secondary | ICD-10-CM | POA: Diagnosis present

## 2022-03-26 DIAGNOSIS — D123 Benign neoplasm of transverse colon: Secondary | ICD-10-CM

## 2022-03-26 MED ORDER — SODIUM CHLORIDE 0.9 % IV SOLN: 500.0000 mL | Freq: Once | INTRAVENOUS | Status: DC

## 2022-03-26 NOTE — Patient Instructions (Signed)
Please read handouts provided. Continue present medications. Await pathology results.   YOU HAD AN ENDOSCOPIC PROCEDURE TODAY AT THE [facility]:   Refer to the procedure report that was given to you for any specific questions about what was found during the examination.  If the procedure report does not answer your questions, please call your gastroenterologist to clarify.  If you requested that your care partner not be given the details of your procedure findings, then the procedure report has been included in a sealed envelope for you to review at your convenience later.  YOU SHOULD EXPECT: Some feelings of bloating in the abdomen. Passage of more gas than usual.  Walking can help get rid of the air that was put into your GI tract during the procedure and reduce the bloating. If you had a lower endoscopy (such as a colonoscopy or flexible sigmoidoscopy) you may notice spotting of blood in your stool or on the toilet paper. If you underwent a bowel prep for your procedure, you may not have a normal bowel movement for a few days.  Please Note:  You might notice some irritation and congestion in your nose or some drainage.  This is from the oxygen used during your procedure.  There is no need for concern and it should clear up in a day or so.  SYMPTOMS TO REPORT IMMEDIATELY:  Following lower endoscopy (colonoscopy or flexible sigmoidoscopy):  Excessive amounts of blood in the stool  Significant tenderness or worsening of abdominal pains  Swelling of the abdomen that is new, acute  Fever of 100F or higher   For urgent or emergent issues, a gastroenterologist can be reached at any hour by calling [phone]. Do not use MyChart messaging for urgent concerns.    DIET:  We do recommend a small meal at first, but then you may proceed to your regular diet.  Drink plenty of fluids but you should avoid alcoholic beverages for 24 hours.  ACTIVITY:  You should plan to take it easy  for the rest of today and you should NOT DRIVE or use heavy machinery until tomorrow (because of the sedation medicines used during the test).    FOLLOW UP: Our staff will call the number listed on your records 48-72 hours following your procedure to check on you and address any questions or concerns that you may have regarding the information given to you following your procedure. If we do not reach you, we will leave a message.  We will attempt to reach you two times.  During this call, we will ask if you have developed any symptoms of COVID 19. If you develop any symptoms (ie: fever, flu-like symptoms, shortness of breath, cough etc.) before then, please call [phone].  If you test positive for Covid 19 in the 2 weeks post procedure, please call and report this information to us.    If any biopsies were taken you will be contacted by phone or by letter within the next 1-3 weeks.  Please call us at [phone] if you have not heard about the biopsies in 3 weeks.    SIGNATURES/CONFIDENTIALITY: You and/or your care partner have signed paperwork which will be entered into your electronic medical record.  These signatures attest to the fact that that the information above on your After Visit Summary has been reviewed and is understood.  Full responsibility of the confidentiality of this discharge information lies with you and/or your care-partner.  

## 2022-03-26 NOTE — Progress Notes (Signed)
[facility] History and Physical   Primary Care Physician:  Patient, No Pcp Per (Inactive)   Reason for Procedure:   History of colon polyps  Plan:    colonoscopy     HPI: John Odonnell is a 56 y.o. male  here for colonoscopy surveillance - 4 TAs removed 06/2018. Patient denies any bowel symptoms at this time. No family history of colon cancer known. Otherwise feels well without any cardiopulmonary symptoms.    Past Medical History:  Diagnosis Date   Atrial fibrillation ([place]) 2016   TEE put back into rythmn   GERD (gastroesophageal reflux disease)    Hyperlipidemia    Hypertension     Past Surgical History:  Procedure Laterality Date   ANTERIOR CRUCIATE LIGAMENT REPAIR  10/22/1993   Left knee   COLONOSCOPY     HERNIA REPAIR     POLYPECTOMY     TONSILLECTOMY AND ADENOIDECTOMY      Prior to Admission medications   Medication Sig Start Date End Date Taking? Authorizing Provider  acetaminophen (TYLENOL) 500 MG tablet Take by mouth.   Yes [provider]  metoprolol succinate (TOPROL-XL) 25 MG 24 hr tablet Take 25 mg by mouth daily. 01/19/22  Yes [provider]  Multiple Vitamins-Minerals (MULTIVITAMIN ADULTS 50+ PO) Take by mouth daily.   Yes [provider]  omeprazole (PRILOSEC) 20 MG capsule Take by mouth.   Yes [provider]  Red Yeast Rice Extract (RED YEAST RICE PO) Take by mouth daily.   Yes [provider]  valsartan-hydrochlorothiazide (DIOVAN-HCT) 320-25 MG tablet Take 1 tablet by mouth daily. 05/31/18  Yes [provider]    Current Outpatient Medications  Medication Sig Dispense Refill   acetaminophen (TYLENOL) 500 MG tablet Take by mouth.     metoprolol succinate (TOPROL-XL) 25 MG 24 hr tablet Take 25 mg by mouth daily.     Multiple Vitamins-Minerals (MULTIVITAMIN ADULTS 50+ PO) Take by mouth daily.     omeprazole (PRILOSEC) 20 MG capsule Take by mouth.     Red Yeast Rice Extract (RED YEAST  RICE PO) Take by mouth daily.     valsartan-hydrochlorothiazide (DIOVAN-HCT) 320-25 MG tablet Take 1 tablet by mouth daily.  3   Current Facility-Administered Medications  Medication Dose Route Frequency Provider Last Rate Last Admin   0.9 %  sodium chloride infusion  500 mL Intravenous Once Kieffer Blatz, Carlota Raspberry, MD        Allergies as of 03/26/2022   (No Known Allergies)    Family History  Problem Relation Age of Onset   Depression Mother    Hypertension Mother    Skin cancer Father 59       anal rectal melanoma   Depression Sister    Depression Brother    Mental illness Maternal Grandmother    Heart disease Paternal Grandfather    Colon polyps Neg Hx    Colon cancer Neg Hx    Esophageal cancer Neg Hx    Stomach cancer Neg Hx    Crohn's disease Neg Hx    Rectal cancer Neg Hx     Social History   Socioeconomic History   Marital status: Married    Spouse name: Not on file   Number of children: Not on file   Years of education: Not on file   Highest education level: Not on file  Occupational History   Not on file  Tobacco Use   Smoking status: Never    Passive exposure: Never  Smokeless tobacco: Never  Vaping Use   Vaping Use: Never used  Substance and Sexual Activity   Alcohol use: Yes    Alcohol/week: 2.0 standard drinks    Types: 2 Glasses of wine per week    Comment: socially   Drug use: No   Sexual activity: Not on file  Other Topics Concern   Not on file  Social History Narrative   Not on file   Social Determinants of Health   Financial Resource Strain: Not on file  Food Insecurity: Not on file  Transportation Needs: Not on file  Physical Activity: Not on file  Stress: Not on file  Social Connections: Not on file  Intimate Partner Violence: Not on file    Review of Systems: All other review of systems negative except as mentioned in the HPI.  Physical Exam: Vital signs BP 109/68   Pulse 60   Temp (!) 96.6 F (35.9 C) (Temporal)   Ht  6' (1.829 m)   Wt 195 lb (88.5 kg)   SpO2 98%   BMI 26.45 kg/m   General:   Alert,  Well-developed, pleasant and cooperative in NAD Lungs:  Clear throughout to auscultation.   Heart:  Regular rate and rhythm Abdomen:  Soft, nontender and nondistended.   Neuro/Psych:  Alert and cooperative. Normal mood and affect. A and O x 3  Jolly Mango, MD [facility]

## 2022-03-26 NOTE — Op Note (Signed)
[facility] Patient Name: John Odonnell Procedure Date: 03/26/2022 7:31 AM MRN: [id number] Endoscopist: Remo Lipps P. Havery Moros , MD Age: 56 Referring MD:  Date of Birth: October 29, 1965 Gender: Male Account #: [id number] Procedure:                Colonoscopy Indications:              High risk colon cancer surveillance: Personal                            history of colonic polyps - 4 adenomas removed                            06/2018 Medicines:                Monitored Anesthesia Care Procedure:                Pre-Anesthesia Assessment:                           - Prior to the procedure, a History and Physical                            was performed, and patient medications and                            allergies were reviewed. The patient's tolerance of                            previous anesthesia was also reviewed. The risks                            and benefits of the procedure and the sedation                            options and risks were discussed with the patient.                            All questions were answered, and informed consent                            was obtained. Prior Anticoagulants: The patient has                            taken no previous anticoagulant or antiplatelet                            agents. ASA Grade Assessment: II - A patient with                            mild systemic disease. After reviewing the risks                            and benefits, the patient was deemed in  satisfactory condition to undergo the procedure.                           After obtaining informed consent, the colonoscope                            was passed under direct vision. Throughout the                            procedure, the patient's blood pressure, pulse, and                            oxygen saturations were monitored continuously. The                            Olympus CF-HQ190L [id number]) Colonoscope was                             introduced through the anus and advanced to the the                            cecum, identified by appendiceal orifice and                            ileocecal valve. The colonoscopy was performed                            without difficulty. The patient tolerated the                            procedure well. The quality of the bowel                            preparation was good. The ileocecal valve,                            appendiceal orifice, and rectum were photographed. Scope In: 8:04:32 AM Scope Out: 8:21:23 AM Scope Withdrawal Time: 0 hours 13 minutes 59 seconds  Total Procedure Duration: 0 hours 16 minutes 51 seconds  Findings:                 The perianal and digital rectal examinations were                            normal.                           A 4 mm polyp was found in the transverse colon. The                            polyp was flat. The polyp was removed with a cold                            snare. Resection and retrieval were complete.  A few small-mouthed diverticula were found in the                            sigmoid colon.                           Internal hemorrhoids were found during                            retroflexion. The hemorrhoids were small.                           Anal papilla(e) were hypertrophied.                           The exam was otherwise without abnormality. Complications:            No immediate complications. Estimated blood loss:                            Minimal. Estimated Blood Loss:     Estimated blood loss was minimal. Impression:               - One 4 mm polyp in the transverse colon, removed                            with a cold snare. Resected and retrieved.                           - Diverticulosis in the sigmoid colon.                           - Internal hemorrhoids.                           - Anal papilla(e) were hypertrophied.                           - The examination  was otherwise normal. Recommendation:           - Patient has a contact number available for                            emergencies. The signs and symptoms of potential                            delayed complications were discussed with the                            patient. Return to normal activities tomorrow.                            Written discharge instructions were provided to the                            patient.                           -  Resume previous diet.                           - Continue present medications.                           - Await pathology results. Remo Lipps P. Denny Lave, MD 03/26/2022 8:24:43 AM This report has been signed electronically.

## 2022-03-26 NOTE — Progress Notes (Signed)
Pt's states no medical or surgical changes since previsit or office visit. 

## 2022-03-26 NOTE — Progress Notes (Signed)
Called to room to assist during endoscopic procedure.  Patient ID and intended procedure confirmed with present staff. Received instructions for my participation in the procedure from the performing physician.  

## 2022-03-26 NOTE — Progress Notes (Signed)
To pacu, VSS. Report to [email or web address] 

## 2022-03-27 ENCOUNTER — Telehealth: Payer: Self-pay | Admitting: *Deleted

## 2022-03-27 NOTE — Telephone Encounter (Signed)
  Follow up Call-     03/26/2022    7:14 AM  Call back number  Post procedure Call Back phone  # [phone]  Permission to leave phone message Yes     Patient questions:  Do you have a fever, pain , or abdominal swelling? No. Pain Score  0 *  Have you tolerated food without any problems? Yes.    Have you been able to return to your normal activities? Yes.    Do you have any questions about your discharge instructions: Diet   No. Medications  No. Follow up visit  No.  Do you have questions or concerns about your Care? No.  Actions: * If pain score is 4 or above: No action needed, pain <4.

## 2022-09-11 ENCOUNTER — Other Ambulatory Visit: Payer: Self-pay | Admitting: Family Medicine

## 2022-09-11 DIAGNOSIS — I2510 Atherosclerotic heart disease of native coronary artery without angina pectoris: Secondary | ICD-10-CM

## 2022-09-11 NOTE — Progress Notes (Signed)
ASCVD 7.8%  H/O Afib and pt thought Cardiac Cath, though no record of this in Epic.  CAC ordered.

## 2022-09-17 ENCOUNTER — Ambulatory Visit
Admission: RE | Admit: 2022-09-17 | Discharge: 2022-09-17 | Disposition: A | Discharge status: Home / Self Care | Payer: No Typology Code available for payment source | Source: Ambulatory Visit | Attending: Family Medicine | Admitting: Family Medicine

## 2022-09-17 DIAGNOSIS — I2510 Atherosclerotic heart disease of native coronary artery without angina pectoris: Secondary | ICD-10-CM

## 2022-10-06 ENCOUNTER — Telehealth: Payer: Self-pay | Admitting: Cardiology

## 2022-10-06 NOTE — Telephone Encounter (Signed)
Patient has markedly abnormal with elevated coronary calcium score and ascending aortic aneurysm, needs consultation, not urgent.  Please set up and let me know when.

## 2022-10-11 ENCOUNTER — Encounter: Payer: Self-pay | Admitting: Cardiology

## 2022-10-11 ENCOUNTER — Ambulatory Visit: Payer: 59 | Admitting: Cardiology

## 2022-10-11 VITALS — BP 131/85 | HR 54 | Resp 16 | Ht 72.0 in | Wt 201.6 lb

## 2022-10-11 DIAGNOSIS — I10 Essential (primary) hypertension: Secondary | ICD-10-CM

## 2022-10-11 DIAGNOSIS — I7121 Aneurysm of the ascending aorta, without rupture: Secondary | ICD-10-CM

## 2022-10-11 DIAGNOSIS — R931 Abnormal findings on diagnostic imaging of heart and coronary circulation: Secondary | ICD-10-CM

## 2022-10-11 DIAGNOSIS — E7800 Pure hypercholesterolemia, unspecified: Secondary | ICD-10-CM

## 2022-10-11 DIAGNOSIS — I2510 Atherosclerotic heart disease of native coronary artery without angina pectoris: Secondary | ICD-10-CM

## 2022-10-11 MED ORDER — ASPIRIN 81 MG PO TBEC: 81.0000 mg | DELAYED_RELEASE_TABLET | Freq: Every day | ORAL | 12 refills | Status: DC

## 2022-10-11 NOTE — Progress Notes (Signed)
ID:  John Odonnell, DOB 06-03-1966, MRN [id number]  PCP:  Waldemar Dickens, MD  Cardiologist:  Rex Kras, DO, [facility] (established care 10/11/22)  REASON FOR CONSULT: Coronary artery calcification and aneurysmal dilatation of the aortic root  REQUESTING PHYSICIAN:  Waldemar Dickens, MD [address],  [place] [address]  Chief Complaint  Patient presents with   New Patient (Initial Visit)    Abnormal calcium score   coronary calcium score    HPI  [name] is a 56 y.o. Caucasian male who presents to the clinic for evaluation of coronary calcification and aneurysmal dilatation of the aortic root at the request of Waldemar Dickens, MD. 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   Patient presents to the office for evaluation of severe coronary artery calcification and aneurysmal aortic root noted on a coronary calcium score which was performed as part of risk stratification given his hyperlipidemia.  Clinically he denies anginal discomfort or heart failure symptoms.  His overall functional capacity remains stable.  No family history of premature CAD/cardiomyopathy/sudden cardiac death.  After his coronary calcium score he was started on atorvastatin 20 mg p.o. nightly and has been taking it for the last week and tolerating it well.  He plans to have a repeat fasting lipid with PCP in the near future.  Currently he walks 2 miles 3 times a week.  During the summer months he is able to play pickle ball, swimming, and tennis.  FUNCTIONAL STATUS: Walks 2 miles three times a week.    ALLERGIES: No Known Allergies  MEDICATION LIST PRIOR TO VISIT: Current Meds  Medication Sig   acetaminophen (TYLENOL) 500 MG tablet Take by mouth.   aspirin EC 81 MG tablet Take 1 tablet (81  mg total) by mouth daily. Swallow whole.   atorvastatin (LIPITOR) 20 MG tablet Take 20 mg by mouth daily.   metoprolol succinate (TOPROL-XL) 25 MG 24 hr tablet Take 25 mg by mouth daily.   Multiple Vitamins-Minerals (MULTIVITAMIN ADULTS 50+ PO) Take by mouth daily.   omeprazole (PRILOSEC) 20 MG capsule Take by mouth.   valsartan-hydrochlorothiazide (DIOVAN-HCT) 320-25 MG tablet Take 1 tablet by mouth daily.     PAST MEDICAL HISTORY: Past Medical History:  Diagnosis Date   Agatston coronary artery calcium score greater than 400    Atrial fibrillation ([place]) 2016   TEE put back into rythmn   GERD (gastroesophageal reflux disease)    Hyperlipidemia    Hypertension     PAST SURGICAL HISTORY: Past Surgical History:  Procedure Laterality Date   ANTERIOR CRUCIATE LIGAMENT REPAIR  10/22/1993   Left knee   COLONOSCOPY     HERNIA REPAIR     POLYPECTOMY     TONSILLECTOMY AND ADENOIDECTOMY      FAMILY HISTORY: The patient family history includes Bipolar disorder in his brother, mother, and sister; Depression in his brother, mother, and sister; Heart disease in his paternal grandfather; Hypertension in his mother; Mental illness in his maternal grandmother; Schizophrenia in his mother; Skin cancer (age of onset: 30) in his father.  SOCIAL HISTORY:  The patient  reports that he has never smoked. He has never been exposed to tobacco smoke. He has never used smokeless tobacco. He reports current alcohol use of about 2.0 standard drinks of alcohol per week. He  reports that he does not use drugs.  REVIEW OF SYSTEMS: Review of Systems  Cardiovascular:  Negative for chest pain, claudication, dyspnea on exertion, irregular heartbeat, leg swelling, near-syncope, orthopnea, palpitations, paroxysmal nocturnal dyspnea and syncope.  Respiratory:  Negative for shortness of breath.   Hematologic/Lymphatic: Negative for bleeding problem.  Musculoskeletal:  Negative for muscle cramps and myalgias.   Neurological:  Negative for dizziness and light-headedness.    PHYSICAL EXAM:    10/11/2022   10:42 AM 03/26/2022    8:44 AM 03/26/2022    8:34 AM  Vitals with BMI  Height _0     Weight 201 lbs 10 oz    BMI 88.28    Systolic [phone]  Diastolic 85 80 75  Pulse 54 57 54    Physical Exam  Constitutional: No distress.  Age appropriate, hemodynamically stable.   Neck: No JVD present.  Cardiovascular: Normal rate, regular rhythm, S1 normal, S2 normal, intact distal pulses and normal pulses. Exam reveals no gallop, no S3 and no S4.  No murmur heard. Pulmonary/Chest: Effort normal and breath sounds normal. No stridor. He has no wheezes. He has no rales.  Abdominal: Soft. Bowel sounds are normal. He exhibits no distension. There is no abdominal tenderness.  Musculoskeletal:        General: No edema.     Cervical back: Neck supple.  Neurological: He is alert and oriented to person, place, and time. He has intact cranial nerves (2-12).  Skin: Skin is warm and moist.   CARDIAC DATABASE: EKG: 10/11/2022: Sinus bradycardia, 51 bpm, without underlying ischemia or injury pattern.   Echocardiogram: 06/11/2018: LVEF 50-56%, grade 1 diastolic dysfunction, trivial AR, mildly dilated left atrium.  Stress Testing: No results found for this or any previous visit from the past 1095 days.   Heart Catheterization: None  LABORATORY DATA: External Labs: Collected: 06/26/2022 Hemoglobin 13.6, hematocrit 42.8%  Collected 05/24/2022  BUN 18, creatinine 1.1. eGFR 80. Sodium 136, potassium 4.2, chloride 98, AST 23, ALT 17, alkaline phosphatase 72. Total cholesterol 210, triglycerides 130, HDL 46, LDL 141. TSH 2.0     Latest Ref Rng & Units 02/17/2014    7:00 AM 02/21/2012   10:02 AM  CBC  WBC 4.0 - 10.5 K/uL 12.7  5.3   Hemoglobin 13.0 - 17.0 g/dL 16.6  15.9   Hematocrit 39.0 - 52.0 % 45.3  44.8   Platelets 150 - 400 K/uL 173  207        Latest Ref Rng & Units 02/17/2014    7:00 AM  07/24/2013   11:45 AM 02/21/2012   10:02 AM  CMP  Glucose 70 - 99 mg/dL 137  72  86   BUN 6 - 23 mg/dL _1 Creatinine 0.50 - 1.35 mg/dL 0.97  0.99  1.03   Sodium 137 - 147 mEq/L 142  139  139   Potassium 3.7 - 5.3 mEq/L 3.8  4.1  4.2   Chloride 96 - 112 mEq/L 102  101  105   CO2 19 - 32 mEq/L _2 Calcium 8.4 - 10.5 mg/dL 9.0  9.5  9.3   Total Protein 6.0 - 8.3 g/dL   6.9   Total Bilirubin 0.3 - 1.2 mg/dL   0.9   Alkaline Phos 39 - 117 U/L   62   AST 0 - 37 U/L   19   ALT 0 - 53 U/L   15  Lipid Panel     Component Value Date/Time   CHOL 196 07/24/2013 1145   TRIG 137 07/24/2013 1145   HDL 47 07/24/2013 1145   CHOLHDL 4.2 07/24/2013 1145   VLDL 27 07/24/2013 1145   LDLCALC 122 (H) 07/24/2013 1145    No components found for: "NTPROBNP" No results for input(s): "PROBNP" in the last 8760 hours. No results for input(s): "TSH" in the last 8760 hours.  BMP No results for input(s): "NA", "K", "CL", "CO2", "GLUCOSE", "BUN", "CREATININE", "CALCIUM", "GFRNONAA", "GFRAA" in the last 8760 hours.  HEMOGLOBIN A1C No results found for: "HGBA1C", "MPG"  IMPRESSION:    ICD-10-CM   1. Agatston CAC score, >400  R93.1 PCV MYOCARDIAL PERFUSION WO LEXISCAN    2. Coronary atherosclerosis due to calcified coronary lesion  I25.10 EKG 12-Lead   I25.84 PCV ECHOCARDIOGRAM COMPLETE    PCV MYOCARDIAL PERFUSION WO LEXISCAN    aspirin EC 81 MG tablet    Lipid Panel With LDL/HDL Ratio    LDL cholesterol, direct    CMP14+EGFR    3. Pure hypercholesterolemia  E78.00     4. Benign hypertension  I10 CT ANGIO CHEST AORTA W/ & OR WO/CM & GATING ([place] ONLY)    5. Aortic root aneurysm (HCC)  I71.21 CT ANGIO CHEST AORTA W/ & OR WO/CM & GATING ( ONLY)       RECOMMENDATIONS: Tyler Cubit is a 56 y.o. 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   Coronary atherosclerosis due to calcified coronary lesion / Agatston CAC score, >400 Total CAC 3490, 99th percentile Denies angina pectoris or heart failure symptoms. Good functional capacity for age as described above. EKG: Nonischemic Started on atorvastatin 20 mg p.o. nightly by PCP approximately 1 week ago. Start aspirin 81 mg p.o. daily Echo will be ordered to evaluate for structural heart disease and left ventricular systolic function. Plan exercise nuclear stress test to evaluate for reversible ischemia I have asked the patient to exercise as tolerated but not exceeding a target a heart rate of 70% of age-predicted maximum heart rate (220 - age) x 0.7 . Educated on seeking medical attention sooner by going to the closest ER via EMS if he has chest pain that increases in intensity, frequency, duration, or has typical chest pain as discussed in the office.  Patient verbalized understanding.  Pure hypercholesterolemia Currently on atorvastatin. Will repeat labs in 6 weeks. Given his severe CAC at such a young age would like to target a goal LDL of 55 mg/dL Monitor for now  Benign hypertension Office blood pressures are within acceptable limits but not at goal. Would recommend splitting the valsartan/hydrochlorothiazide into 2 separate tablets.  He can take losartan at night and HCTZ in the morning.  Patient states he will discuss it for him with PCP. Reemphasized the importance of low-salt diet Will recommend a goal SBP of 120 mmHg if able to tolerate.  Aortic root aneurysm (HCC) Coronary calcium score noted the aortic root to be 45 mm. Echo from 2019 also reported that the root to be 45 mm. As recommended by radiology we will repeat a CT aorta protocol (gated) in 6 months to reevaluate dimensions. Reemphasized the importance of blood pressure management Also discussed symptoms of  aortic syndromes and if present he should go to the closest ER via  EMS for further evaluation and management.  Data Reviewed: I have independently reviewed external notes provided by the referring provider as part of this office visit.   I have independently reviewed results of labs, echo, CAC score report as part of medical decision making. I have ordered the following tests:  Orders Placed This Encounter  Procedures   CT ANGIO CHEST AORTA W/ & OR WO/CM & GATING ([place] ONLY)    Standing Status:   Future    Standing Expiration Date:   04/12/2023    Order Specific Question:   If indicated for the ordered procedure, I authorize the administration of contrast media per Radiology protocol    Answer:   Yes    Order Specific Question:   Preferred imaging location?    Answer:   [facility]   Lipid Panel With LDL/HDL Ratio    Standing Status:   Future    Standing Expiration Date:   10/12/2023   LDL cholesterol, direct    Standing Status:   Future    Standing Expiration Date:   10/12/2023   CMP14+EGFR    Standing Status:   Future    Standing Expiration Date:   10/12/2023   PCV MYOCARDIAL PERFUSION WO LEXISCAN    Standing Status:   Future    Standing Expiration Date:   10/12/2023   EKG 12-Lead   PCV ECHOCARDIOGRAM COMPLETE    Standing Status:   Future    Standing Expiration Date:   10/12/2023   I have made medications changes at today's encounter as noted above.  FINAL MEDICATION LIST END OF ENCOUNTER: Meds ordered this encounter  Medications   aspirin EC 81 MG tablet    Sig: Take 1 tablet (81 mg total) by mouth daily. Swallow whole.    Dispense:  30 tablet    Refill:  12    Medications Discontinued During This Encounter  Medication Reason   Red Yeast Rice Extract (RED YEAST RICE PO)      Current Outpatient Medications:    acetaminophen (TYLENOL) 500 MG tablet, Take by mouth., Disp: , Rfl:    aspirin EC 81 MG tablet, Take 1 tablet (81 mg total) by mouth daily.  Swallow whole., Disp: 30 tablet, Rfl: 12   atorvastatin (LIPITOR) 20 MG tablet, Take 20 mg by mouth daily., Disp: , Rfl:    metoprolol succinate (TOPROL-XL) 25 MG 24 hr tablet, Take 25 mg by mouth daily., Disp: , Rfl:    Multiple Vitamins-Minerals (MULTIVITAMIN ADULTS 50+ PO), Take by mouth daily., Disp: , Rfl:    omeprazole (PRILOSEC) 20 MG capsule, Take by mouth., Disp: , Rfl:    valsartan-hydrochlorothiazide (DIOVAN-HCT) 320-25 MG tablet, Take 1 tablet by mouth daily., Disp: , Rfl: 3  Orders Placed This Encounter  Procedures   CT ANGIO CHEST AORTA W/ & OR WO/CM & GATING ([place] ONLY)   Lipid Panel With LDL/HDL Ratio   LDL cholesterol, direct   CMP14+EGFR   PCV MYOCARDIAL PERFUSION WO LEXISCAN   EKG 12-Lead   PCV ECHOCARDIOGRAM COMPLETE    There are no Patient Instructions on file for this visit.   --Continue cardiac medications as reconciled in final medication list. --Return in about 7 weeks (around 11/29/2022) for Follow up, Coronary artery calcification, Review test results. or sooner if needed. --Continue follow-up with your primary care physician regarding the management of your other chronic comorbid conditions.  Patient's questions and concerns were addressed to his satisfaction. He voices understanding of the instructions provided  during this encounter.   This note was created using a voice recognition software as a result there may be grammatical errors inadvertently enclosed that do not reflect the nature of this encounter. Every attempt is made to correct such errors.  Rex Kras, [place], [facility]  Pager: [phone] Office: [phone]

## 2022-10-30 ENCOUNTER — Ambulatory Visit: Payer: 59

## 2022-10-30 DIAGNOSIS — I2510 Atherosclerotic heart disease of native coronary artery without angina pectoris: Secondary | ICD-10-CM

## 2022-10-30 DIAGNOSIS — R931 Abnormal findings on diagnostic imaging of heart and coronary circulation: Secondary | ICD-10-CM

## 2022-11-01 ENCOUNTER — Ambulatory Visit: Payer: 59

## 2022-11-01 DIAGNOSIS — I2510 Atherosclerotic heart disease of native coronary artery without angina pectoris: Secondary | ICD-10-CM

## 2022-11-05 NOTE — Progress Notes (Signed)
Appt is rescheduled to sooner

## 2022-11-06 ENCOUNTER — Ambulatory Visit: Payer: 59 | Admitting: Cardiology

## 2022-11-06 ENCOUNTER — Encounter: Payer: Self-pay | Admitting: Cardiology

## 2022-11-06 VITALS — BP 130/78 | HR 64 | Resp 18 | Ht 72.0 in | Wt 206.4 lb

## 2022-11-06 DIAGNOSIS — I10 Essential (primary) hypertension: Secondary | ICD-10-CM

## 2022-11-06 DIAGNOSIS — R9439 Abnormal result of other cardiovascular function study: Secondary | ICD-10-CM

## 2022-11-06 DIAGNOSIS — E7800 Pure hypercholesterolemia, unspecified: Secondary | ICD-10-CM

## 2022-11-06 DIAGNOSIS — R931 Abnormal findings on diagnostic imaging of heart and coronary circulation: Secondary | ICD-10-CM

## 2022-11-06 DIAGNOSIS — I7121 Aneurysm of the ascending aorta, without rupture: Secondary | ICD-10-CM

## 2022-11-06 DIAGNOSIS — I2510 Atherosclerotic heart disease of native coronary artery without angina pectoris: Secondary | ICD-10-CM

## 2022-11-06 MED ORDER — NITROGLYCERIN 0.4 MG SL SUBL: 0.4000 mg | SUBLINGUAL_TABLET | SUBLINGUAL | 0 refills | Status: DC | PRN

## 2022-11-06 NOTE — H&P (View-Only) (Signed)
ID:  John Odonnell, DOB 06-05-1966, MRN [id number]  PCP:  Waldemar Dickens, MD  Cardiologist:  Rex Kras, DO, [facility] (established care 10/11/22)  Date: 11/06/22 Last Office Visit: 10/11/2022  Chief Complaint  Patient presents with   Follow-up    Follow up, Coronary artery calcification, Review test results.    HPI  John Odonnell is a 57 y.o. 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   She was referred to the practice for evaluation and management of severe coronary artery calcification and aneurysmal aortic root noted on coronary calcium score.  Patient has known history of hyperlipidemia for which he chose not to be treated for several years.  And eventually underwent coronary calcium score for further restratification which noted a total CAC of 3490 placing him at the 99th percentile.  Patient is asked to come in today sooner than his scheduled visit as his myocardial perfusion imaging was concerning for reversible ischemia in the LAD as well as the RCA/LCx distribution.  He denies anginal discomfort.  He had good functional capacity on the recent exercise nuclear stress test.  He is tolerating atorvastatin 20 mg p.o. nightly well with intentions of having a 6-week lipid profile in February 2024.  He is accompanied by his wife [name] at today's office visit as well as provides collateral history.  He currently walks 2 miles 3 times a week and during the summer months he is able to play pickle ball, swim, and tennis.  No family history of premature coronary artery disease/cardiomyopathy/sudden cardiac death.   ALLERGIES: No Known Allergies  MEDICATION LIST PRIOR TO VISIT: Current Meds  Medication Sig   acetaminophen (TYLENOL) 500 MG tablet Take by mouth.    aspirin EC 81 MG tablet Take 1 tablet (81 mg total) by mouth daily. Swallow whole.   atorvastatin (LIPITOR) 20 MG tablet Take 20 mg by mouth daily.   hydrochlorothiazide (HYDRODIURIL) 25 MG tablet Take 25 mg by mouth daily.   metoprolol succinate (TOPROL-XL) 25 MG 24 hr tablet Take 25 mg by mouth daily.   Multiple Vitamins-Minerals (MULTIVITAMIN ADULTS 50+ PO) Take by mouth daily.   nitroGLYCERIN (NITROSTAT) 0.4 MG SL tablet Place 1 tablet (0.4 mg total) under the tongue every 5 (five) minutes as needed for chest pain. If you require more than two tablets five minutes apart go to the nearest ER via EMS.   omeprazole (PRILOSEC) 20 MG capsule Take by mouth.   valsartan (DIOVAN) 320 MG tablet Take 320 mg by mouth daily.     PAST MEDICAL HISTORY: Past Medical History:  Diagnosis Date   Agatston coronary artery calcium score greater than 400    Atrial fibrillation ([place]) 2016   TEE put back into rythmn   GERD (gastroesophageal reflux disease)    Hyperlipidemia    Hypertension     PAST SURGICAL HISTORY: Past Surgical History:  Procedure Laterality Date   ANTERIOR CRUCIATE LIGAMENT REPAIR  10/22/1993   Left knee   COLONOSCOPY     HERNIA REPAIR     POLYPECTOMY     TONSILLECTOMY AND ADENOIDECTOMY      FAMILY HISTORY: The patient family history includes Bipolar disorder in his brother, mother, and sister; Depression in his brother, mother, and sister; Heart attack in his maternal grandfather and maternal uncle; Heart disease in his paternal grandfather; Hypertension  in his maternal aunt, maternal grandfather, maternal grandmother, maternal uncle, and mother; Mental illness in his maternal grandmother; Schizophrenia in his mother; Skin cancer (age of onset: 62) in his father.  SOCIAL HISTORY:  The patient  reports that he has never smoked. He has never been exposed to tobacco smoke. He has never used smokeless tobacco. He reports current alcohol use of about 2.0 standard drinks of alcohol per  week. He reports that he does not use drugs.  REVIEW OF SYSTEMS: Review of Systems  Cardiovascular:  Negative for chest pain, claudication, dyspnea on exertion, irregular heartbeat, leg swelling, near-syncope, orthopnea, palpitations, paroxysmal nocturnal dyspnea and syncope.  Respiratory:  Negative for shortness of breath.   Hematologic/Lymphatic: Negative for bleeding problem.  Musculoskeletal:  Negative for muscle cramps and myalgias.  Neurological:  Negative for dizziness and light-headedness.    PHYSICAL EXAM:    11/06/2022    2:57 PM 10/11/2022   10:42 AM 03/26/2022    8:44 AM  Vitals with BMI  Height '6\' 0"'$  '6\' 0"'$    Weight 206 lbs 6 oz 201 lbs 10 oz   BMI 01.60 10.93   Systolic 235 573 220  Diastolic 78 85 80  Pulse 64 54 57    Physical Exam  Constitutional: No distress.  Age appropriate, hemodynamically stable.   Neck: No JVD present.  Cardiovascular: Normal rate, regular rhythm, S1 normal, S2 normal, intact distal pulses and normal pulses. Exam reveals no gallop, no S3 and no S4.  No murmur heard. Pulmonary/Chest: Effort normal and breath sounds normal. No stridor. He has no wheezes. He has no rales.  Abdominal: Soft. Bowel sounds are normal. He exhibits no distension. There is no abdominal tenderness.  Musculoskeletal:        General: No edema.     Cervical back: Neck supple.  Neurological: He is alert and oriented to person, place, and time. He has intact cranial nerves (2-12).  Skin: Skin is warm and moist.   RADIOLOGY: CAC Report 09/17/2022: Total Agatston score: 3490   [place] database percentile: 99   4.5 cm ascending thoracic aortic aneurysm at the aortic root. Recommend semi-annual imaging followup by CTA or MRA and referral to cardiothoracic surgery if not already obtained. This recommendation follows 2010 ACCF/AHA/AATS/ACR/ASA/SCA/SCAI/SIR/STS/SVM Guidelines for the Diagnosis and Management of Patients With Thoracic Aortic Disease. Circulation. 2010; 121:  U542-H062. Aortic aneurysm NOS (ICD10-I71.9)  CARDIAC DATABASE: EKG: 10/11/2022: Sinus bradycardia, 51 bpm, without underlying ischemia or injury pattern.   Echocardiogram: 06/11/2018: LVEF 37-62%, grade 1 diastolic dysfunction, trivial AR, mildly dilated left atrium.  11/01/2022:  Normal LV systolic function with visual EF 55-60%. Left ventricle cavity is normal in size. Mild concentric hypertrophy of the left ventricle. Normal global wall motion. Doppler evidence of grade I (impaired) diastolic dysfunction, normal LAP. Calculated EF 59%. Structurally normal mitral valve. Mild (Grade I) mitral regurgitation. Structurally normal tricuspid valve. Mild tricuspid regurgitation. No evidence of pulmonary hypertension. The aortic root is mildly dilated at the SoV measuring 4.2 cm. Compared to 03/2014, there is mild aortic dilatation   Stress Testing: Exercise Myoview stress test 10/30/2022: Exercise nuclear stress test was performed using Bruce protocol.  1 Day Rest and Stress images. Exercise time 8 minutes 52 seconds on Bruce protocol, achieved 10.16 METS, 88% of APMHR.  Stress ECG negative for ischemia.  Small size, moderate intensity, reversible perfusion defect suggestive of ischemia in mid to distal LAD/diagonal distribution. Medium size, mild to moderate intensity, reversible perfusion defect suggestive of ischemia in the RCA/LCx  distribution. Calculated LVEF 51%, visually appears preserved. Wall thickness is preserved and no obvious regional wall motion abnormalities. No prior studies available for comparison. Intermediate risk study.   Heart Catheterization: None  LABORATORY DATA: External Labs: Collected: 06/26/2022 Hemoglobin 13.6, hematocrit 42.8%  Collected 05/24/2022  BUN 18, creatinine 1.1. eGFR 80. Sodium 136, potassium 4.2, chloride 98, AST 23, ALT 17, alkaline phosphatase 72. Total cholesterol 210, triglycerides 130, HDL 46, LDL 141. TSH 2.0  External  Labs: Collected: 10/24/2022 provided by the patient. BUN 15, creatinine 1.02. Sodium 138, potassium 4.2, chloride 96, bicarb 24. AST 24, ALT 19, alkaline phosphatase 76. Total cholesterol 195, triglycerides 159, HDL 54, LDL direct 116, non-HDL 113      Latest Ref Rng & Units 02/17/2014    7:00 AM 02/21/2012   10:02 AM  CBC  WBC 4.0 - 10.5 K/uL 12.7  5.3   Hemoglobin 13.0 - 17.0 g/dL 16.6  15.9   Hematocrit 39.0 - 52.0 % 45.3  44.8   Platelets 150 - 400 K/uL 173  207        Latest Ref Rng & Units 02/17/2014    7:00 AM 07/24/2013   11:45 AM 02/21/2012   10:02 AM  CMP  Glucose 70 - 99 mg/dL 137  72  86   BUN 6 - 23 mg/dL '15  16  15   '$ Creatinine 0.50 - 1.35 mg/dL 0.97  0.99  1.03   Sodium 137 - 147 mEq/L 142  139  139   Potassium 3.7 - 5.3 mEq/L 3.8  4.1  4.2   Chloride 96 - 112 mEq/L 102  101  105   CO2 19 - 32 mEq/L '26  27  25   '$ Calcium 8.4 - 10.5 mg/dL 9.0  9.5  9.3   Total Protein 6.0 - 8.3 g/dL   6.9   Total Bilirubin 0.3 - 1.2 mg/dL   0.9   Alkaline Phos 39 - 117 U/L   62   AST 0 - 37 U/L   19   ALT 0 - 53 U/L   15     Lipid Panel     Component Value Date/Time   CHOL 196 07/24/2013 1145   TRIG 137 07/24/2013 1145   HDL 47 07/24/2013 1145   CHOLHDL 4.2 07/24/2013 1145   VLDL 27 07/24/2013 1145   LDLCALC 122 (H) 07/24/2013 1145    No components found for: "NTPROBNP" No results for input(s): "PROBNP" in the last 8760 hours. No results for input(s): "TSH" in the last 8760 hours.  BMP No results for input(s): "NA", "K", "CL", "CO2", "GLUCOSE", "BUN", "CREATININE", "CALCIUM", "GFRNONAA", "GFRAA" in the last 8760 hours.  HEMOGLOBIN A1C No results found for: "HGBA1C", "MPG"  IMPRESSION:    ICD-10-CM   1. Abnormal nuclear stress test  R94.39 CBC    Basic metabolic panel    nitroGLYCERIN (NITROSTAT) 0.4 MG SL tablet    2. Agatston CAC score, >400  R93.1 CBC    Basic metabolic panel    nitroGLYCERIN (NITROSTAT) 0.4 MG SL tablet    3. Coronary atherosclerosis due  to calcified coronary lesion  I25.10 CBC   X72.62 Basic metabolic panel    nitroGLYCERIN (NITROSTAT) 0.4 MG SL tablet    4. Pure hypercholesterolemia  E78.00     5. Benign hypertension  I10     6. Aortic root aneurysm ([place])  I71.21        RECOMMENDATIONS: Alonte Wulff is a 57 y.o. 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   Abnormal nuclear stress test Coronary atherosclerosis due to calcified coronary lesion Agatston CAC score, >400 Total CAC 3490, 99th percentile Denies angina pectoris or heart failure symptoms. Good functional capacity for age as described above. EKG: Nonischemic. Echo: Preserved LVEF, grade 1 diastolic impairment, mild MR/TR. MPI: Illustrates reversible myocardial perfusion defect in the LAD and LCx/RCA distribution.  Images of the nuclear stress test reviewed in great detail with both the patient and his wife at today's office visit. We discussed medical management, coronary CTA, and invasive angiography.  After discussing the risks, benefits, alternatives, and limitations that shared decision was to proceed with left heart catheterization with possible intervention. Continue aspirin and statin therapy Will prescribe sublingual nitroglycerin tablets to use on appearing basis.  Medication profile discussed.  Patient is aware of the drug to drug interactions with erectile dysfunction medications. Will repeat fasting lipid profile in 6 weeks around first week of February 2024. Would recommend a goal LDL of <55 mg/dL.  The procedure of left heart catheterization with possible intervention was explained to the patient and his wife in detail.  The indication, alternatives, risks and benefits were reviewed.  Complications include but not limited to  bleeding, infection, vascular injury, stroke, myocardial infarction, arrhythmia (requiring medical or cardiopulmonary resuscitation), kidney injury (requiring short-term or long-term hemodialysis), radiation-related injury in the case of prolonged fluoroscopy use, emergent cardiac surgery, temporary or permanent pacemaker, and death. The patient and his wife [name] understands the risks of serious complication is 1-2 in 0569 with diagnostic cardiac cath and 1-2% or less with angioplasty/stenting. The patient and wife voices understanding and provides verbal feedback their questions and concerns are addressed to their satisfaction and patient wishes to proceed with coronary angiography with possible PCI.  Pure hypercholesterolemia Hypertriglyceridemia Currently on atorvastatin. Will repeat labs in 6 weeks -in February 2024 Given his severe CAC at such a young age would like to target a goal LDL of 55 mg/dL Monitor for now  Benign hypertension Office blood pressures are within acceptable range. Home blood pressures are better controlled after starting to take hydrochlorothiazide in the morning and valsartan at night. Recommend a goal SBP of 120 mmHg given his dilated aortic root. Reemphasized the importance of low-salt diet  Aortic root aneurysm (HCC) Coronary calcium score noted the aortic root to be 45 mm. Echo from 2019 also reported that the root to be 45 mm. As recommended by radiology we will repeat a CT aorta protocol (gated) in 6 months to reevaluate dimensions. On ARB and Beta Blockers Reemphasized the importance of blood pressure management Also discussed symptoms of aortic syndromes and if present he should go to the closest ER via EMS for further evaluation and management.  FINAL MEDICATION LIST END OF ENCOUNTER: Meds ordered this encounter  Medications   nitroGLYCERIN (NITROSTAT) 0.4 MG SL tablet    Sig: Place 1 tablet (0.4 mg total) under the tongue every 5 (five) minutes as  needed for chest pain. If you require more than two tablets five minutes apart go to the nearest ER via EMS.    Dispense:  30 tablet    Refill:  0    Medications Discontinued During This Encounter  Medication Reason   valsartan-hydrochlorothiazide (DIOVAN-HCT) 320-25 MG tablet Change in therapy     Current Outpatient Medications:  acetaminophen (TYLENOL) 500 MG tablet, Take by mouth., Disp: , Rfl:    aspirin EC 81 MG tablet, Take 1 tablet (81 mg total) by mouth daily. Swallow whole., Disp: 30 tablet, Rfl: 12   atorvastatin (LIPITOR) 20 MG tablet, Take 20 mg by mouth daily., Disp: , Rfl:    hydrochlorothiazide (HYDRODIURIL) 25 MG tablet, Take 25 mg by mouth daily., Disp: , Rfl:    metoprolol succinate (TOPROL-XL) 25 MG 24 hr tablet, Take 25 mg by mouth daily., Disp: , Rfl:    Multiple Vitamins-Minerals (MULTIVITAMIN ADULTS 50+ PO), Take by mouth daily., Disp: , Rfl:    nitroGLYCERIN (NITROSTAT) 0.4 MG SL tablet, Place 1 tablet (0.4 mg total) under the tongue every 5 (five) minutes as needed for chest pain. If you require more than two tablets five minutes apart go to the nearest ER via EMS., Disp: 30 tablet, Rfl: 0   omeprazole (PRILOSEC) 20 MG capsule, Take by mouth., Disp: , Rfl:    valsartan (DIOVAN) 320 MG tablet, Take 320 mg by mouth daily., Disp: , Rfl:   Orders Placed This Encounter  Procedures   CBC   Basic metabolic panel    There are no Patient Instructions on file for this visit.   --Continue cardiac medications as reconciled in final medication list. --Return in about 2 weeks (around 11/20/2022) for Follow up, CAD, after heart cath. or sooner if needed. --Continue follow-up with your primary care physician regarding the management of your other chronic comorbid conditions.  Patient's questions and concerns were addressed to his satisfaction. He voices understanding of the instructions provided during this encounter.   This note was created using a voice recognition  software as a result there may be grammatical errors inadvertently enclosed that do not reflect the nature of this encounter. Every attempt is made to correct such errors.  Rex Kras, [place], [facility]  Pager: [phone] Office: [phone]

## 2022-11-06 NOTE — Progress Notes (Signed)
ID:  John Odonnell, DOB 06-Sep-1966, MRN [id number]  PCP:  Waldemar Dickens, MD  Cardiologist:  Rex Kras, DO, [facility] (established care 10/11/22)  Date: 11/06/22 Last Office Visit: 10/11/2022  Chief Complaint  Patient presents with   Follow-up    Follow up, Coronary artery calcification, Review test results.    HPI  John Odonnell is a 57 y.o. 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   She was referred to the practice for evaluation and management of severe coronary artery calcification and aneurysmal aortic root noted on coronary calcium score.  Patient has known history of hyperlipidemia for which he chose not to be treated for several years.  And eventually underwent coronary calcium score for further restratification which noted a total CAC of 3490 placing him at the 99th percentile.  Patient is asked to come in today sooner than his scheduled visit as his myocardial perfusion imaging was concerning for reversible ischemia in the LAD as well as the RCA/LCx distribution.  He denies anginal discomfort.  He had good functional capacity on the recent exercise nuclear stress test.  He is tolerating atorvastatin 20 mg p.o. nightly well with intentions of having a 6-week lipid profile in February 2024.  He is accompanied by his wife John Odonnell at today's office visit as well as provides collateral history.  He currently walks 2 miles 3 times a week and during the summer months he is able to play pickle ball, swim, and tennis.  No family history of premature coronary artery disease/cardiomyopathy/sudden cardiac death.   ALLERGIES: No Known Allergies  MEDICATION LIST PRIOR TO VISIT: Current Meds  Medication Sig   acetaminophen (TYLENOL) 500 MG tablet Take by mouth.    aspirin EC 81 MG tablet Take 1 tablet (81 mg total) by mouth daily. Swallow whole.   atorvastatin (LIPITOR) 20 MG tablet Take 20 mg by mouth daily.   hydrochlorothiazide (HYDRODIURIL) 25 MG tablet Take 25 mg by mouth daily.   metoprolol succinate (TOPROL-XL) 25 MG 24 hr tablet Take 25 mg by mouth daily.   Multiple Vitamins-Minerals (MULTIVITAMIN ADULTS 50+ PO) Take by mouth daily.   nitroGLYCERIN (NITROSTAT) 0.4 MG SL tablet Place 1 tablet (0.4 mg total) under the tongue every 5 (five) minutes as needed for chest pain. If you require more than two tablets five minutes apart go to the nearest ER via EMS.   omeprazole (PRILOSEC) 20 MG capsule Take by mouth.   valsartan (DIOVAN) 320 MG tablet Take 320 mg by mouth daily.     PAST MEDICAL HISTORY: Past Medical History:  Diagnosis Date   Agatston coronary artery calcium score greater than 400    Atrial fibrillation ([place]) 2016   TEE put back into rythmn   GERD (gastroesophageal reflux disease)    Hyperlipidemia    Hypertension     PAST SURGICAL HISTORY: Past Surgical History:  Procedure Laterality Date   ANTERIOR CRUCIATE LIGAMENT REPAIR  10/22/1993   Left knee   COLONOSCOPY     HERNIA REPAIR     POLYPECTOMY     TONSILLECTOMY AND ADENOIDECTOMY      FAMILY HISTORY: The patient family history includes Bipolar disorder in his brother, mother, and sister; Depression in his brother, mother, and sister; Heart attack in his maternal grandfather and maternal uncle; Heart disease in his paternal grandfather; Hypertension  in his maternal aunt, maternal grandfather, maternal grandmother, maternal uncle, and mother; Mental illness in his maternal grandmother; Schizophrenia in his mother; Skin cancer (age of onset: 3) in his father.  SOCIAL HISTORY:  The patient  reports that he has never smoked. He has never been exposed to tobacco smoke. He has never used smokeless tobacco. He reports current alcohol use of about 2.0 standard drinks of alcohol per  week. He reports that he does not use drugs.  REVIEW OF SYSTEMS: Review of Systems  Cardiovascular:  Negative for chest pain, claudication, dyspnea on exertion, irregular heartbeat, leg swelling, near-syncope, orthopnea, palpitations, paroxysmal nocturnal dyspnea and syncope.  Respiratory:  Negative for shortness of breath.   Hematologic/Lymphatic: Negative for bleeding problem.  Musculoskeletal:  Negative for muscle cramps and myalgias.  Neurological:  Negative for dizziness and light-headedness.    PHYSICAL EXAM:    11/06/2022    2:57 PM 10/11/2022   10:42 AM 03/26/2022    8:44 AM  Vitals with BMI  Height '6\' 0"'$  '6\' 0"'$    Weight 206 lbs 6 oz 201 lbs 10 oz   BMI 12.87 86.76   Systolic 720 947 096  Diastolic 78 85 80  Pulse 64 54 57    Physical Exam  Constitutional: No distress.  Age appropriate, hemodynamically stable.   Neck: No JVD present.  Cardiovascular: Normal rate, regular rhythm, S1 normal, S2 normal, intact distal pulses and normal pulses. Exam reveals no gallop, no S3 and no S4.  No murmur heard. Pulmonary/Chest: Effort normal and breath sounds normal. No stridor. He has no wheezes. He has no rales.  Abdominal: Soft. Bowel sounds are normal. He exhibits no distension. There is no abdominal tenderness.  Musculoskeletal:        General: No edema.     Cervical back: Neck supple.  Neurological: He is alert and oriented to person, place, and time. He has intact cranial nerves (2-12).  Skin: Skin is warm and moist.   RADIOLOGY: CAC Report 09/17/2022: Total Agatston score: 3490   [place] database percentile: 99   4.5 cm ascending thoracic aortic aneurysm at the aortic root. Recommend semi-annual imaging followup by CTA or MRA and referral to cardiothoracic surgery if not already obtained. This recommendation follows 2010 ACCF/AHA/AATS/ACR/ASA/SCA/SCAI/SIR/STS/SVM Guidelines for the Diagnosis and Management of Patients With Thoracic Aortic Disease. Circulation. 2010; 121:  G836-O294. Aortic aneurysm NOS (ICD10-I71.9)  CARDIAC DATABASE: EKG: 10/11/2022: Sinus bradycardia, 51 bpm, without underlying ischemia or injury pattern.   Echocardiogram: 06/11/2018: LVEF 76-54%, grade 1 diastolic dysfunction, trivial AR, mildly dilated left atrium.  11/01/2022:  Normal LV systolic function with visual EF 55-60%. Left ventricle cavity is normal in size. Mild concentric hypertrophy of the left ventricle. Normal global wall motion. Doppler evidence of grade I (impaired) diastolic dysfunction, normal LAP. Calculated EF 59%. Structurally normal mitral valve. Mild (Grade I) mitral regurgitation. Structurally normal tricuspid valve. Mild tricuspid regurgitation. No evidence of pulmonary hypertension. The aortic root is mildly dilated at the SoV measuring 4.2 cm. Compared to 03/2014, there is mild aortic dilatation   Stress Testing: Exercise Myoview stress test 10/30/2022: Exercise nuclear stress test was performed using Bruce protocol.  1 Day Rest and Stress images. Exercise time 8 minutes 52 seconds on Bruce protocol, achieved 10.16 METS, 88% of APMHR.  Stress ECG negative for ischemia.  Small size, moderate intensity, reversible perfusion defect suggestive of ischemia in mid to distal LAD/diagonal distribution. Medium size, mild to moderate intensity, reversible perfusion defect suggestive of ischemia in the RCA/LCx  distribution. Calculated LVEF 51%, visually appears preserved. Wall thickness is preserved and no obvious regional wall motion abnormalities. No prior studies available for comparison. Intermediate risk study.   Heart Catheterization: None  LABORATORY DATA: External Labs: Collected: 06/26/2022 Hemoglobin 13.6, hematocrit 42.8%  Collected 05/24/2022  BUN 18, creatinine 1.1. eGFR 80. Sodium 136, potassium 4.2, chloride 98, AST 23, ALT 17, alkaline phosphatase 72. Total cholesterol 210, triglycerides 130, HDL 46, LDL 141. TSH 2.0  External  Labs: Collected: 10/24/2022 provided by the patient. BUN 15, creatinine 1.02. Sodium 138, potassium 4.2, chloride 96, bicarb 24. AST 24, ALT 19, alkaline phosphatase 76. Total cholesterol 195, triglycerides 159, HDL 54, LDL direct 116, non-HDL 113      Latest Ref Rng & Units 02/17/2014    7:00 AM 02/21/2012   10:02 AM  CBC  WBC 4.0 - 10.5 K/uL 12.7  5.3   Hemoglobin 13.0 - 17.0 g/dL 16.6  15.9   Hematocrit 39.0 - 52.0 % 45.3  44.8   Platelets 150 - 400 K/uL 173  207        Latest Ref Rng & Units 02/17/2014    7:00 AM 07/24/2013   11:45 AM 02/21/2012   10:02 AM  CMP  Glucose 70 - 99 mg/dL 137  72  86   BUN 6 - 23 mg/dL '15  16  15   '$ Creatinine 0.50 - 1.35 mg/dL 0.97  0.99  1.03   Sodium 137 - 147 mEq/L 142  139  139   Potassium 3.7 - 5.3 mEq/L 3.8  4.1  4.2   Chloride 96 - 112 mEq/L 102  101  105   CO2 19 - 32 mEq/L '26  27  25   '$ Calcium 8.4 - 10.5 mg/dL 9.0  9.5  9.3   Total Protein 6.0 - 8.3 g/dL   6.9   Total Bilirubin 0.3 - 1.2 mg/dL   0.9   Alkaline Phos 39 - 117 U/L   62   AST 0 - 37 U/L   19   ALT 0 - 53 U/L   15     Lipid Panel     Component Value Date/Time   CHOL 196 07/24/2013 1145   TRIG 137 07/24/2013 1145   HDL 47 07/24/2013 1145   CHOLHDL 4.2 07/24/2013 1145   VLDL 27 07/24/2013 1145   LDLCALC 122 (H) 07/24/2013 1145    No components found for: "NTPROBNP" No results for input(s): "PROBNP" in the last 8760 hours. No results for input(s): "TSH" in the last 8760 hours.  BMP No results for input(s): "NA", "K", "CL", "CO2", "GLUCOSE", "BUN", "CREATININE", "CALCIUM", "GFRNONAA", "GFRAA" in the last 8760 hours.  HEMOGLOBIN A1C No results found for: "HGBA1C", "MPG"  IMPRESSION:    ICD-10-CM   1. Abnormal nuclear stress test  R94.39 CBC    Basic metabolic panel    nitroGLYCERIN (NITROSTAT) 0.4 MG SL tablet    2. Agatston CAC score, >400  R93.1 CBC    Basic metabolic panel    nitroGLYCERIN (NITROSTAT) 0.4 MG SL tablet    3. Coronary atherosclerosis due  to calcified coronary lesion  I25.10 CBC   O35.00 Basic metabolic panel    nitroGLYCERIN (NITROSTAT) 0.4 MG SL tablet    4. Pure hypercholesterolemia  E78.00     5. Benign hypertension  I10     6. Aortic root aneurysm ([place])  I71.21        RECOMMENDATIONS: Crispin Vogel is a 57 y.o. 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   Abnormal nuclear stress test Coronary atherosclerosis due to calcified coronary lesion Agatston CAC score, >400 Total CAC 3490, 99th percentile Denies angina pectoris or heart failure symptoms. Good functional capacity for age as described above. EKG: Nonischemic. Echo: Preserved LVEF, grade 1 diastolic impairment, mild MR/TR. MPI: Illustrates reversible myocardial perfusion defect in the LAD and LCx/RCA distribution.  Images of the nuclear stress test reviewed in great detail with both the patient and his wife at today's office visit. We discussed medical management, coronary CTA, and invasive angiography.  After discussing the risks, benefits, alternatives, and limitations that shared decision was to proceed with left heart catheterization with possible intervention. Continue aspirin and statin therapy Will prescribe sublingual nitroglycerin tablets to use on appearing basis.  Medication profile discussed.  Patient is aware of the drug to drug interactions with erectile dysfunction medications. Will repeat fasting lipid profile in 6 weeks around first week of February 2024. Would recommend a goal LDL of <55 mg/dL.  The procedure of left heart catheterization with possible intervention was explained to the patient and his wife in detail.  The indication, alternatives, risks and benefits were reviewed.  Complications include but not limited to  bleeding, infection, vascular injury, stroke, myocardial infarction, arrhythmia (requiring medical or cardiopulmonary resuscitation), kidney injury (requiring short-term or long-term hemodialysis), radiation-related injury in the case of prolonged fluoroscopy use, emergent cardiac surgery, temporary or permanent pacemaker, and death. The patient and his wife John Odonnell understands the risks of serious complication is 1-2 in 2683 with diagnostic cardiac cath and 1-2% or less with angioplasty/stenting. The patient and wife voices understanding and provides verbal feedback their questions and concerns are addressed to their satisfaction and patient wishes to proceed with coronary angiography with possible PCI.  Pure hypercholesterolemia Hypertriglyceridemia Currently on atorvastatin. Will repeat labs in 6 weeks -in February 2024 Given his severe CAC at such a young age would like to target a goal LDL of 55 mg/dL Monitor for now  Benign hypertension Office blood pressures are within acceptable range. Home blood pressures are better controlled after starting to take hydrochlorothiazide in the morning and valsartan at night. Recommend a goal SBP of 120 mmHg given his dilated aortic root. Reemphasized the importance of low-salt diet  Aortic root aneurysm ([facility]) Coronary calcium score noted the aortic root to be 45 mm. Echo from 2019 also reported that the root to be 45 mm. As recommended by radiology we will repeat a CT aorta protocol (gated) in 6 months to reevaluate dimensions. On ARB and Beta Blockers Reemphasized the importance of blood pressure management Also discussed symptoms of aortic syndromes and if present he should go to the closest ER via EMS for further evaluation and management.  FINAL MEDICATION LIST END OF ENCOUNTER: Meds ordered this encounter  Medications   nitroGLYCERIN (NITROSTAT) 0.4 MG SL tablet    Sig: Place 1 tablet (0.4 mg total) under the tongue every 5 (five) minutes as  needed for chest pain. If you require more than two tablets five minutes apart go to the nearest ER via EMS.    Dispense:  30 tablet    Refill:  0    Medications Discontinued During This Encounter  Medication Reason   valsartan-hydrochlorothiazide (DIOVAN-HCT) 320-25 MG tablet Change in therapy     Current Outpatient Medications:  acetaminophen (TYLENOL) 500 MG tablet, Take by mouth., Disp: , Rfl:    aspirin EC 81 MG tablet, Take 1 tablet (81 mg total) by mouth daily. Swallow whole., Disp: 30 tablet, Rfl: 12   atorvastatin (LIPITOR) 20 MG tablet, Take 20 mg by mouth daily., Disp: , Rfl:    hydrochlorothiazide (HYDRODIURIL) 25 MG tablet, Take 25 mg by mouth daily., Disp: , Rfl:    metoprolol succinate (TOPROL-XL) 25 MG 24 hr tablet, Take 25 mg by mouth daily., Disp: , Rfl:    Multiple Vitamins-Minerals (MULTIVITAMIN ADULTS 50+ PO), Take by mouth daily., Disp: , Rfl:    nitroGLYCERIN (NITROSTAT) 0.4 MG SL tablet, Place 1 tablet (0.4 mg total) under the tongue every 5 (five) minutes as needed for chest pain. If you require more than two tablets five minutes apart go to the nearest ER via EMS., Disp: 30 tablet, Rfl: 0   omeprazole (PRILOSEC) 20 MG capsule, Take by mouth., Disp: , Rfl:    valsartan (DIOVAN) 320 MG tablet, Take 320 mg by mouth daily., Disp: , Rfl:   Orders Placed This Encounter  Procedures   CBC   Basic metabolic panel    There are no Patient Instructions on file for this visit.   --Continue cardiac medications as reconciled in final medication list. --Return in about 2 weeks (around 11/20/2022) for Follow up, CAD, after heart cath. or sooner if needed. --Continue follow-up with your primary care physician regarding the management of your other chronic comorbid conditions.  Patient's questions and concerns were addressed to his satisfaction. He voices understanding of the instructions provided during this encounter.   This note was created using a voice recognition  software as a result there may be grammatical errors inadvertently enclosed that do not reflect the nature of this encounter. Every attempt is made to correct such errors.  Rex Kras, [place], [facility]  Pager: [phone] Office: [phone]

## 2022-11-11 NOTE — Progress Notes (Signed)
External Labs: Collected:  11/07/2022  hemoglobin 13.3, hematocrit 39%  BUN 13, creatinine 1.04. Sodium 139, potassium 4.5, chloride 99, bicarb  26,  A1c 5.4  Collected 10/24/2022  total cholesterol 195, triglycerides 159, HDL 54, direct LDL116, non-HDL 113

## 2022-11-16 ENCOUNTER — Encounter (HOSPITAL_COMMUNITY)
Admission: RE | Disposition: A | Discharge status: Home / Self Care | Payer: Self-pay | Source: Home / Self Care | Attending: Cardiology

## 2022-11-16 ENCOUNTER — Other Ambulatory Visit: Payer: Self-pay

## 2022-11-16 ENCOUNTER — Ambulatory Visit (HOSPITAL_COMMUNITY)
Admission: RE | Admit: 2022-11-16 | Discharge: 2022-11-16 | Disposition: A | Discharge status: Home / Self Care | Payer: 59 | Attending: Cardiology | Admitting: Cardiology

## 2022-11-16 DIAGNOSIS — I2584 Coronary atherosclerosis due to calcified coronary lesion: Secondary | ICD-10-CM | POA: Diagnosis not present

## 2022-11-16 DIAGNOSIS — I2510 Atherosclerotic heart disease of native coronary artery without angina pectoris: Secondary | ICD-10-CM | POA: Diagnosis present

## 2022-11-16 DIAGNOSIS — I330 Acute and subacute infective endocarditis: Secondary | ICD-10-CM

## 2022-11-16 DIAGNOSIS — E785 Hyperlipidemia, unspecified: Secondary | ICD-10-CM | POA: Insufficient documentation

## 2022-11-16 DIAGNOSIS — I10 Essential (primary) hypertension: Secondary | ICD-10-CM | POA: Diagnosis not present

## 2022-11-16 DIAGNOSIS — I7121 Aneurysm of the ascending aorta, without rupture: Secondary | ICD-10-CM | POA: Insufficient documentation

## 2022-11-16 DIAGNOSIS — Z79899 Other long term (current) drug therapy: Secondary | ICD-10-CM | POA: Insufficient documentation

## 2022-11-16 HISTORY — PX: CORONARY ANGIOGRAPHY: CATH118303

## 2022-11-16 HISTORY — PX: CORONARY PRESSURE/FFR STUDY: CATH118243

## 2022-11-16 SURGERY — INTRAVASCULAR PRESSURE WIRE/FFR STUDY
Anesthesia: LOCAL

## 2022-11-16 MED ORDER — ASPIRIN 81 MG PO CHEW: 81.0000 mg | CHEWABLE_TABLET | ORAL | Status: DC

## 2022-11-16 MED ORDER — LIDOCAINE HCL (PF) 1 % IJ SOLN
INTRAMUSCULAR | Status: AC
  Filled 2022-11-16: qty 30

## 2022-11-16 MED ORDER — NITROGLYCERIN 1 MG/10 ML FOR IR/CATH LAB
INTRA_ARTERIAL | Status: DC | PRN
  Administered 2022-11-16: 200 ug via INTRACORONARY

## 2022-11-16 MED ORDER — SODIUM CHLORIDE 0.9% FLUSH: 3.0000 mL | INTRAVENOUS | Status: DC | PRN

## 2022-11-16 MED ORDER — FENTANYL CITRATE (PF) 100 MCG/2ML IJ SOLN
INTRAMUSCULAR | Status: AC
  Filled 2022-11-16: qty 2

## 2022-11-16 MED ORDER — ACETAMINOPHEN 325 MG PO TABS: 650.0000 mg | ORAL_TABLET | ORAL | Status: DC | PRN

## 2022-11-16 MED ORDER — VERAPAMIL HCL 2.5 MG/ML IV SOLN
INTRAVENOUS | Status: AC
  Filled 2022-11-16: qty 2

## 2022-11-16 MED ORDER — NITROGLYCERIN 1 MG/10 ML FOR IR/CATH LAB
INTRA_ARTERIAL | Status: AC
  Filled 2022-11-16: qty 10

## 2022-11-16 MED ORDER — MIDAZOLAM HCL 2 MG/2ML IJ SOLN
INTRAMUSCULAR | Status: DC | PRN
  Administered 2022-11-16: 2 mg via INTRAVENOUS

## 2022-11-16 MED ORDER — VERAPAMIL HCL 2.5 MG/ML IV SOLN
INTRAVENOUS | Status: DC | PRN
  Administered 2022-11-16: 10 mL via INTRA_ARTERIAL

## 2022-11-16 MED ORDER — SODIUM CHLORIDE 0.9% FLUSH: 3.0000 mL | Freq: Two times a day (BID) | INTRAVENOUS | Status: DC

## 2022-11-16 MED ORDER — HEPARIN (PORCINE) IN NACL 1000-0.9 UT/500ML-% IV SOLN
INTRAVENOUS | Status: AC
  Filled 2022-11-16: qty 1000

## 2022-11-16 MED ORDER — ONDANSETRON HCL 4 MG/2ML IJ SOLN: 4.0000 mg | Freq: Four times a day (QID) | INTRAMUSCULAR | Status: DC | PRN

## 2022-11-16 MED ORDER — SODIUM CHLORIDE 0.9 % WEIGHT BASED INFUSION: 1.0000 mL/kg/h | INTRAVENOUS | Status: DC

## 2022-11-16 MED ORDER — CLOPIDOGREL BISULFATE 75 MG PO TABS: 75.0000 mg | ORAL_TABLET | Freq: Every day | ORAL | 3 refills | Status: DC

## 2022-11-16 MED ORDER — SODIUM CHLORIDE 0.9 % WEIGHT BASED INFUSION: 3.0000 mL/kg/h | INTRAVENOUS | Status: AC

## 2022-11-16 MED ORDER — HYDRALAZINE HCL 20 MG/ML IJ SOLN: 10.0000 mg | INTRAMUSCULAR | Status: DC | PRN

## 2022-11-16 MED ORDER — FENTANYL CITRATE (PF) 100 MCG/2ML IJ SOLN
INTRAMUSCULAR | Status: DC | PRN
  Administered 2022-11-16: 25 ug via INTRAVENOUS

## 2022-11-16 MED ORDER — MIDAZOLAM HCL 2 MG/2ML IJ SOLN
INTRAMUSCULAR | Status: AC
  Filled 2022-11-16: qty 2

## 2022-11-16 MED ORDER — SODIUM CHLORIDE 0.9 % WEIGHT BASED INFUSION
3.0000 mL/kg/h | INTRAVENOUS | Status: DC
  Administered 2022-11-16: 3 mL/kg/h via INTRAVENOUS

## 2022-11-16 MED ORDER — SODIUM CHLORIDE 0.9 % IV SOLN: 250.0000 mL | INTRAVENOUS | Status: DC | PRN

## 2022-11-16 MED ORDER — HEPARIN SODIUM (PORCINE) 1000 UNIT/ML IJ SOLN
INTRAMUSCULAR | Status: DC | PRN
  Administered 2022-11-16 (×2): 4000 [IU] via INTRAVENOUS

## 2022-11-16 MED ORDER — LABETALOL HCL 5 MG/ML IV SOLN: 10.0000 mg | INTRAVENOUS | Status: DC | PRN

## 2022-11-16 MED ORDER — HEPARIN SODIUM (PORCINE) 1000 UNIT/ML IJ SOLN
INTRAMUSCULAR | Status: AC
  Filled 2022-11-16: qty 10

## 2022-11-16 MED ORDER — HEPARIN (PORCINE) IN NACL 1000-0.9 UT/500ML-% IV SOLN
INTRAVENOUS | Status: DC | PRN
  Administered 2022-11-16 (×2): 500 mL

## 2022-11-16 MED ORDER — IOHEXOL 350 MG/ML SOLN
INTRAVENOUS | Status: DC | PRN
  Administered 2022-11-16: 85 mL

## 2022-11-16 MED ORDER — LIDOCAINE HCL (PF) 1 % IJ SOLN
INTRAMUSCULAR | Status: DC | PRN
  Administered 2022-11-16: 2 mL

## 2022-11-16 SURGICAL SUPPLY — 12 items
CATH INFINITI 5FR JK (CATHETERS) IMPLANT
CATH INFINITI JR4 5F (CATHETERS) IMPLANT
DEVICE RAD COMP TR BAND LRG (VASCULAR PRODUCTS) IMPLANT
GLIDESHEATH SLEND A-KIT 6F 22G (SHEATH) IMPLANT
GUIDEWIRE INQWIRE 1.5J.035X260 (WIRE) IMPLANT
GUIDEWIRE PRESSURE X 175 (WIRE) IMPLANT
INQWIRE 1.5J .035X260CM (WIRE) ×1
KIT ESSENTIALS PG (KITS) IMPLANT
KIT HEART LEFT (KITS) ×1 IMPLANT
PACK CARDIAC CATHETERIZATION (CUSTOM PROCEDURE TRAY) ×1 IMPLANT
TRANSDUCER W/STOPCOCK (MISCELLANEOUS) ×1 IMPLANT
TUBING CIL FLEX 10 FLL-RA (TUBING) ×1 IMPLANT

## 2022-11-16 NOTE — Interval H&P Note (Signed)
History and Physical Interval Note:  11/16/2022 12:50 PM  John Odonnell  has presented today for surgery, with the diagnosis of positive stress test, CAD.  The various methods of treatment have been discussed with the patient and family. After consideration of risks, benefits and other options for treatment, the patient has consented to  Procedure(s): LEFT HEART CATH AND CORONARY ANGIOGRAPHY (N/A) and possible angioplasty as a surgical intervention.  The patient's history has been reviewed, patient examined, no change in status, stable for surgery.  I have reviewed the patient's chart and labs.  Questions were answered to the patient's satisfaction.   Cath Lab Visit (complete for each Cath Lab visit)  Clinical Evaluation Leading to the Procedure:   ACS: No.  Non-ACS:    Anginal Classification: CCS I  Anti-ischemic medical therapy: No Therapy  Non-Invasive Test Results: High-risk stress test findings: cardiac mortality >3%/year  Prior CABG: No previous CABG    Adrian Prows

## 2022-11-17 DIAGNOSIS — I2510 Atherosclerotic heart disease of native coronary artery without angina pectoris: Secondary | ICD-10-CM

## 2022-11-17 DIAGNOSIS — I330 Acute and subacute infective endocarditis: Secondary | ICD-10-CM

## 2022-11-19 ENCOUNTER — Encounter (HOSPITAL_COMMUNITY): Payer: Self-pay | Admitting: Cardiology

## 2022-11-29 ENCOUNTER — Ambulatory Visit: Payer: 59 | Admitting: Cardiology

## 2022-12-03 ENCOUNTER — Ambulatory Visit: Payer: 59 | Admitting: Cardiology

## 2022-12-04 ENCOUNTER — Encounter: Payer: Self-pay | Admitting: Cardiology

## 2022-12-04 ENCOUNTER — Ambulatory Visit: Payer: 59 | Admitting: Cardiology

## 2022-12-04 VITALS — BP 122/77 | HR 56 | Resp 16 | Ht 72.0 in | Wt 198.4 lb

## 2022-12-04 DIAGNOSIS — Q2543 Congenital aneurysm of aorta: Secondary | ICD-10-CM

## 2022-12-04 DIAGNOSIS — I7121 Aneurysm of the ascending aorta, without rupture: Secondary | ICD-10-CM

## 2022-12-04 DIAGNOSIS — I2510 Atherosclerotic heart disease of native coronary artery without angina pectoris: Secondary | ICD-10-CM

## 2022-12-04 DIAGNOSIS — E7800 Pure hypercholesterolemia, unspecified: Secondary | ICD-10-CM

## 2022-12-04 DIAGNOSIS — I10 Essential (primary) hypertension: Secondary | ICD-10-CM

## 2022-12-04 DIAGNOSIS — R931 Abnormal findings on diagnostic imaging of heart and coronary circulation: Secondary | ICD-10-CM

## 2022-12-04 NOTE — Progress Notes (Signed)
ID:  John Odonnell, DOB [birth year], MRN [id number]  PCP:  Waldemar Dickens, MD  Cardiologist:  Rex Kras, DO, [facility] (established care 10/11/22)  Date: 12/04/22 Last Office Visit: 11/06/2022  Chief Complaint  Patient presents with   Coronary Artery Disease   Follow-up    2 week    HPI  John Odonnell is a 57 y.o. 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   Referred to the practice for severe coronary artery calcification and aneurysmal dilatation of the aortic root noted on coronary calcium report.  No history of hyperlipidemia for which he chose not to be treated for several years and eventually underwent coronary calcium score and was noted to have a total CAC of 3490, placing him at the 99th percentile.  He underwent functional assessment was noted to have reversible ischemia and at the last visit was recommended to undergo angiography.  Patient was noted to have CAD predominantly in the RCA distribution but based on invasive hemodynamics not significant.  He presents today for follow-up.  He denies anginal discomfort or heart failure symptoms.  He is increasing physical activity.  He is tolerating atorvastatin 20 mg p.o. nightly.  Fasting lipids 6 weeks later still pending.  He is accompanied by his wife [name] at today's office visit.  He continues to walk 2 miles 3 times a week and in the summer months plays pickle ball, swims, tennis.  I have advised him not to increase his heart rate more than 70% of age-predicted maximum heart rate based on the formula to 220-age.  No family history of premature coronary artery disease/cardiomyopathy/sudden cardiac death.   ALLERGIES: No Known Allergies  MEDICATION LIST PRIOR TO VISIT: Current Meds  Medication Sig    acetaminophen (TYLENOL) 500 MG tablet Take 1,000 mg by mouth every 6 (six) hours as needed for moderate pain.   atorvastatin (LIPITOR) 20 MG tablet Take 20 mg by mouth daily.   clopidogrel (PLAVIX) 75 MG tablet Take 1 tablet (75 mg total) by mouth daily.   hydrochlorothiazide (HYDRODIURIL) 25 MG tablet Take 25 mg by mouth daily.   Ketotifen Fumarate (ITCHY EYE DROPS OP) Place 1 drop into both eyes daily as needed (itchy eyes).   metoprolol succinate (TOPROL-XL) 25 MG 24 hr tablet Take 25 mg by mouth daily.   Multiple Vitamins-Minerals (CENTRUM SILVER 50+MEN PO) Take by mouth.   Multiple Vitamins-Minerals (MULTIVITAMIN ADULTS 50+ PO) Take 1 tablet by mouth daily.   nitroGLYCERIN (NITROSTAT) 0.4 MG SL tablet Place 1 tablet (0.4 mg total) under the tongue every 5 (five) minutes as needed for chest pain. If you require more than two tablets five minutes apart go to the nearest ER via EMS.   omeprazole (PRILOSEC) 20 MG capsule Take 20 mg by mouth daily.   valsartan (DIOVAN) 320 MG tablet Take 320 mg by mouth at bedtime.     PAST MEDICAL HISTORY: Past Medical History:  Diagnosis Date   Agatston coronary artery calcium score greater than 400    Atrial fibrillation ([place]) 2016   TEE put back into rythmn   GERD (gastroesophageal reflux disease)    Hyperlipidemia    Hypertension     PAST SURGICAL HISTORY: Past Surgical History:  Procedure Laterality Date   ANTERIOR CRUCIATE LIGAMENT REPAIR  10/22/1993   Left knee   COLONOSCOPY  CORONARY ANGIOGRAPHY N/A 11/16/2022   Procedure: CORONARY ANGIOGRAPHY;  Surgeon: Adrian Prows, MD;  Location: [facility];  Service: Cardiovascular;  Laterality: N/A;   HERNIA REPAIR     INTRAVASCULAR PRESSURE WIRE/FFR STUDY N/A 11/16/2022   Procedure: INTRAVASCULAR PRESSURE WIRE/FFR STUDY;  Surgeon: Adrian Prows, MD;  Location: [facility];  Service: Cardiovascular;  Laterality: N/A;   POLYPECTOMY     TONSILLECTOMY AND ADENOIDECTOMY      FAMILY  HISTORY: The patient family history includes Bipolar disorder in his brother, mother, and sister; Depression in his brother, mother, and sister; Heart attack in his maternal grandfather and maternal uncle; Heart disease in his maternal uncle and paternal grandfather; Hypertension in his maternal aunt, maternal grandfather, maternal grandmother, maternal uncle, and mother; Mental illness in his maternal grandmother; Schizophrenia in his mother; Skin cancer (age of onset: 22) in his father.  SOCIAL HISTORY:  The patient  reports that he has never smoked. He has never been exposed to tobacco smoke. He has never used smokeless tobacco. He reports current alcohol use of about 2.0 standard drinks of alcohol per week. He reports that he does not use drugs.  REVIEW OF SYSTEMS: Review of Systems  Cardiovascular:  Negative for chest pain, claudication, dyspnea on exertion, irregular heartbeat, leg swelling, near-syncope, orthopnea, palpitations, paroxysmal nocturnal dyspnea and syncope.  Respiratory:  Negative for shortness of breath.   Hematologic/Lymphatic: Negative for bleeding problem.  Musculoskeletal:  Negative for muscle cramps and myalgias.  Neurological:  Negative for dizziness and light-headedness.    PHYSICAL EXAM:    12/04/2022    1:28 PM 11/16/2022    3:53 PM 11/16/2022    3:30 PM  Vitals with BMI  Height 6' 0"$     Weight 198 lbs 6 oz    BMI [id number]    Systolic [id number]  [id number]  Diastolic 77  72  Pulse 56 56 57    Physical Exam  Constitutional: No distress.  Age appropriate, hemodynamically stable.   Neck: No JVD present.  Cardiovascular: Normal rate, regular rhythm, S1 normal, S2 normal, intact distal pulses and normal pulses. Exam reveals no gallop, no S3 and no S4.  No murmur heard. Pulmonary/Chest: Effort normal and breath sounds normal. No stridor. He has no wheezes. He has no rales.  Abdominal: Soft. Bowel sounds are normal. He exhibits no distension. There is no abdominal tenderness.   Musculoskeletal:        General: No edema.     Cervical back: Neck supple.  Neurological: He is alert and oriented to person, place, and time. He has intact cranial nerves (2-12).  Skin: Skin is warm and moist.   RADIOLOGY: CAC Report 09/17/2022: Total Agatston score: 3490   [place] database percentile: 99   4.5 cm ascending thoracic aortic aneurysm at the aortic root. Recommend semi-annual imaging followup by CTA or MRA and referral to cardiothoracic surgery if not already obtained. This recommendation follows 2010 ACCF/AHA/AATS/ACR/ASA/SCA/SCAI/SIR/STS/SVM Guidelines for the Diagnosis and Management of Patients With Thoracic Aortic Disease. Circulation. 2010; [id number]. Aortic aneurysm NOS (ICD10-I71.9)  CARDIAC DATABASE: EKG: 10/11/2022: Sinus bradycardia, 51 bpm, without underlying ischemia or injury pattern.   Echocardiogram: 06/11/2018: LVEF 123456, grade 1 diastolic dysfunction, trivial AR, mildly dilated left atrium.  11/01/2022:  Normal LV systolic function with visual EF 55-60%. Left ventricle cavity is normal in size. Mild concentric hypertrophy of the left ventricle. Normal global wall motion. Doppler evidence of grade I (impaired) diastolic dysfunction, normal LAP. Calculated EF 59%. Structurally normal  mitral valve. Mild (Grade I) mitral regurgitation. Structurally normal tricuspid valve. Mild tricuspid regurgitation. No evidence of pulmonary hypertension. The aortic root is mildly dilated at the SoV measuring 4.2 cm. Compared to 03/2014, there is mild aortic dilatation   Stress Testing: Exercise Myoview stress test 10/30/2022: Exercise nuclear stress test was performed using Bruce protocol.  1 Day Rest and Stress images. Exercise time 8 minutes 52 seconds on Bruce protocol, achieved 10.16 METS, 88% of APMHR.  Stress ECG negative for ischemia.  Small size, moderate intensity, reversible perfusion defect suggestive of ischemia in mid to distal LAD/diagonal  distribution. Medium size, mild to moderate intensity, reversible perfusion defect suggestive of ischemia in the RCA/LCx distribution. Calculated LVEF 51%, visually appears preserved. Wall thickness is preserved and no obvious regional wall motion abnormalities. No prior studies available for comparison. Intermediate risk study.   Heart Catheterization: Left Heart Catheterization 11/16/22:  LM: Large-caliber vessel, smooth and normal, mildly calcified. LAD: Large-caliber vessel in the proximal segment, mild diffuse disease in the proximal segment, mild to moderate calcification is also evident especially in the proximal segment with a 30% stenosis.  Mid segment has a long tubular 30% stenosis. RI: Large-caliber vessel.  Smooth and normal. CX: Moderate caliber vessel in proximal segment with mild disease in the proximal segment of 30%.  He was origin to a moderate-sized OM1 and OM 2.  Mild disease is evident in the mid circumflex. RCA: Very large caliber vessel, superdominant, diffuse luminal irregularity in the proximal and mid segment with severe calcification of the proximal to mid to distal segment of the right coronary artery.  There is a large calcific spicule in the mid segment of the RCA giving a 90% stenosis appearance in some views and a 50% appearance in the other views. Pressure wire performed, CFR was 0.95.  Not hemodynamically significant.    Impression:  Moderate coronary artery disease involving the LAD and circumflex, severe calcification of a very large superdominant right coronary artery with a complex mid RCA stenosis which appears to be 80 to 90% but not hemodynamically significant, suspect medial calcification.   Aggressive risk factor modification is indicated.   Aspirin was discontinued, patient was started on Plavix.   Patient also has slow flow in all the coronary vessels suggestive of microvascular dysfunction    LABORATORY DATA: External Labs: Collected:  06/26/2022 Hemoglobin 13.6, hematocrit 42.8%  Collected 05/24/2022  BUN 18, creatinine 1.1. eGFR 80. Sodium 136, potassium 4.2, chloride 98, AST 23, ALT 17, alkaline phosphatase 72. Total cholesterol 210, triglycerides 130, HDL 46, LDL 141. TSH 2.0  External Labs: Collected: 10/24/2022 provided by the patient. BUN 15, creatinine 1.02. Sodium 138, potassium 4.2, chloride 96, bicarb 24. AST 24, ALT 19, alkaline phosphatase 76. Total cholesterol 195, triglycerides 159, HDL 54, LDL direct 116, non-HDL 113  External Labs: Collected:  11/07/2022  hemoglobin 13.3, hematocrit 39%  BUN 13, creatinine 1.04. Sodium 139, potassium 4.5, chloride 99, bicarb  26,  A1c 5.4      Latest Ref Rng & Units 02/17/2014    7:00 AM 02/21/2012   10:02 AM  CBC  WBC 4.0 - 10.5 K/uL 12.7  5.3   Hemoglobin 13.0 - 17.0 g/dL 16.6  15.9   Hematocrit 39.0 - 52.0 % 45.3  44.8   Platelets 150 - 400 K/uL 173  207        Latest Ref Rng & Units 02/17/2014    7:00 AM 07/24/2013   11:45 AM 02/21/2012   10:02 AM  CMP  Glucose 70 - 99 mg/dL 137  72  86   BUN 6 - 23 mg/dL 15  16  15   $ Creatinine 0.50 - 1.35 mg/dL 0.97  0.99  1.03   Sodium 137 - 147 mEq/L 142  139  139   Potassium 3.7 - 5.3 mEq/L 3.8  4.1  4.2   Chloride 96 - 112 mEq/L 102  101  105   CO2 19 - 32 mEq/L 26  27  25   $ Calcium 8.4 - 10.5 mg/dL 9.0  9.5  9.3   Total Protein 6.0 - 8.3 g/dL   6.9   Total Bilirubin 0.3 - 1.2 mg/dL   0.9   Alkaline Phos 39 - 117 U/L   62   AST 0 - 37 U/L   19   ALT 0 - 53 U/L   15     Lipid Panel     Component Value Date/Time   CHOL 196 07/24/2013 1145   TRIG 137 07/24/2013 1145   HDL 47 07/24/2013 1145   CHOLHDL 4.2 07/24/2013 1145   VLDL 27 07/24/2013 1145   LDLCALC 122 (H) 07/24/2013 1145    No components found for: "NTPROBNP" No results for input(s): "PROBNP" in the last 8760 hours. No results for input(s): "TSH" in the last 8760 hours.  BMP No results for input(s): "NA", "K", "CL", "CO2", "GLUCOSE", "BUN",  "CREATININE", "CALCIUM", "GFRNONAA", "GFRAA" in the last 8760 hours.  HEMOGLOBIN A1C No results found for: "HGBA1C", "MPG"  IMPRESSION:    ICD-10-CM   1. Coronary artery disease involving native coronary artery of native heart without angina pectoris  I25.10 Lipoprotein A (LPA)    Apo A1 + B + Ratio    2. Coronary atherosclerosis due to calcified coronary lesion  I25.10 CMP14+EGFR   I25.84 LDL cholesterol, direct    Lipid Panel With LDL/HDL Ratio    3. Agatston CAC score, >400  R93.1 Lipoprotein A (LPA)    Apo A1 + B + Ratio    4. Pure hypercholesterolemia  E78.00     5. Benign hypertension  I10 CT ANGIO CHEST AORTA W/ & OR WO/CM & GATING ([place] ONLY)    6. Aortic root aneurysm (HCC)  I71.21 CT ANGIO CHEST AORTA W/ & OR WO/CM & GATING ([place] ONLY)        RECOMMENDATIONS: John Odonnell is a 57 y.o. 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   Coronary artery disease without angina pectoris Coronary atherosclerosis due to calcified coronary lesion Agatston CAC score, >400 Total CAC 3490, 99th percentile Denies angina pectoris or heart failure symptoms. Good functional capacity for age as described above. EKG: Nonischemic. Echo: Preserved LVEF, grade 1 diastolic impairment, mild MR/TR. MPI: Illustrates reversible myocardial perfusion defect in the LAD and LCx/RCA distribution.  Angiography: Results reviewed with the patient and wife at today's visit.  Nonobstructive disease in the LAD and LCx but obstructive disease in the RCA which is not hemodynamically significant based on invasive hemodynamics. Recommend aggressive management of his cardiovascular risk factors. Recommend LDL goal less than 55 mg/dL. I have asked him to have his lipids checked as  originally scheduled for February 2024. Would like to uptitrate statins and/or add Nexlizet prior to considering PCSK9 inhibitors. No use of sublingual nitroglycerin tablets. If he has new onset of  chest pain he is asked to go to the ER via EMS for further evaluation and management.  Pure hypercholesterolemia Hypertriglyceridemia Currently on atorvastatin. Will reorder fasting lipid profile, LP(a), and apolipoprotein's Goal LDL < 55 mg/dL Monitor for now  Benign hypertension Office blood pressures are within range. Home blood pressures are also well-controlled  Recommend a goal SBP of 120 mmHg given his dilated aortic root. Reemphasized the importance of low-salt diet  Aortic root aneurysm (HCC) Coronary calcium score noted the aortic root to be 45 mm. Echo from 2019 also reported that the root to be 45 mm. As recommended by radiology we will repeat a CT aorta protocol (gated) in 6 months to reevaluate dimensions, orders for June 2024 On ARB and Beta Blockers Reemphasized the importance of blood pressure management Also discussed symptoms of aortic syndromes and if present he should go to the closest ER via EMS for further evaluation and management.  FINAL MEDICATION LIST END OF ENCOUNTER: No orders of the defined types were placed in this encounter.   There are no discontinued medications.    Current Outpatient Medications:    acetaminophen (TYLENOL) 500 MG tablet, Take 1,000 mg by mouth every 6 (six) hours as needed for moderate pain., Disp: , Rfl:    atorvastatin (LIPITOR) 20 MG tablet, Take 20 mg by mouth daily., Disp: , Rfl:    clopidogrel (PLAVIX) 75 MG tablet, Take 1 tablet (75 mg total) by mouth daily., Disp: 90 tablet, Rfl: 3   hydrochlorothiazide (HYDRODIURIL) 25 MG tablet, Take 25 mg by mouth daily., Disp: , Rfl:    Ketotifen Fumarate (ITCHY EYE DROPS OP), Place 1 drop into both eyes daily as needed (itchy eyes)., Disp: , Rfl:    metoprolol succinate (TOPROL-XL) 25 MG  24 hr tablet, Take 25 mg by mouth daily., Disp: , Rfl:    Multiple Vitamins-Minerals (CENTRUM SILVER 50+MEN PO), Take by mouth., Disp: , Rfl:    Multiple Vitamins-Minerals (MULTIVITAMIN ADULTS 50+ PO), Take 1 tablet by mouth daily., Disp: , Rfl:    nitroGLYCERIN (NITROSTAT) 0.4 MG SL tablet, Place 1 tablet (0.4 mg total) under the tongue every 5 (five) minutes as needed for chest pain. If you require more than two tablets five minutes apart go to the nearest ER via EMS., Disp: 30 tablet, Rfl: 0   omeprazole (PRILOSEC) 20 MG capsule, Take 20 mg by mouth daily., Disp: , Rfl:    valsartan (DIOVAN) 320 MG tablet, Take 320 mg by mouth at bedtime., Disp: , Rfl:   Orders Placed This Encounter  Procedures   Lipoprotein A (LPA)   Apo A1 + B + Ratio    There are no Patient Instructions on file for this visit.   --Continue cardiac medications as reconciled in final medication list. --Return in about 5 months (around 05/04/2023) for Follow up, CAD, CT follow up . or sooner if needed. --Continue follow-up with your primary care physician regarding the management of your other chronic comorbid conditions.  Patient's questions and concerns were addressed to his satisfaction. He voices understanding of the instructions provided during this encounter.   This note was created using a voice recognition software as a result there may be grammatical errors inadvertently enclosed that do not reflect the nature of this encounter. Every attempt is made to correct such errors.  Rex Kras, [place], [facility]  Pager: [phone] Office: [phone]

## 2022-12-16 NOTE — Progress Notes (Signed)
External Labs: Collected: 12/06/2022 Apolipoprotein A1 126 (within normal limits). Apolipoprotein B 76 (within normal limits) LP(a) 118.1 (normal values <75)  Apolipoprotein's are within acceptable limits.  LP(a) elevated.  Ladeana Laplant [place], DO, [facility]

## 2023-05-03 ENCOUNTER — Ambulatory Visit: Payer: 59 | Admitting: Cardiology

## 2023-05-10 ENCOUNTER — Encounter: Payer: Self-pay | Admitting: Cardiology

## 2023-05-10 ENCOUNTER — Ambulatory Visit: Payer: 59 | Admitting: Cardiology

## 2023-05-10 VITALS — BP 120/71 | HR 60 | Resp 16 | Ht 72.0 in | Wt 192.4 lb

## 2023-05-10 DIAGNOSIS — I7121 Aneurysm of the ascending aorta, without rupture: Secondary | ICD-10-CM

## 2023-05-10 DIAGNOSIS — R931 Abnormal findings on diagnostic imaging of heart and coronary circulation: Secondary | ICD-10-CM

## 2023-05-10 DIAGNOSIS — E7800 Pure hypercholesterolemia, unspecified: Secondary | ICD-10-CM

## 2023-05-10 DIAGNOSIS — I10 Essential (primary) hypertension: Secondary | ICD-10-CM

## 2023-05-10 DIAGNOSIS — I2510 Atherosclerotic heart disease of native coronary artery without angina pectoris: Secondary | ICD-10-CM

## 2023-05-10 MED ORDER — ATORVASTATIN CALCIUM 80 MG PO TABS: 80.0000 mg | ORAL_TABLET | Freq: Every day | ORAL | 3 refills | Status: DC

## 2023-05-10 NOTE — Progress Notes (Signed)
ID:  [name], DOB 29-Apr-1966, MRN [id number]  PCP:  Ozella Rocks, MD  Cardiologist:  Tessa Lerner, DO, [facility] (established care 10/11/22)  Date: 05/10/23 Last Office Visit: 12/04/2022  Chief Complaint  Patient presents with   Follow-up    Coronary disease. Hyperlipidemia    HPI  John Odonnell is a 57 y.o. 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   Referred to the practice for severe coronary artery calcification and aneurysmal dilatation of the aortic root noted on coronary calcium report.  Known  history of hyperlipidemia for which he chose not to be treated for several years and eventually underwent coronary calcium score and was noted to have a total CAC of 3490, placing him at the 99th percentile.  He underwent functional assessment was noted to have reversible ischemia and later underwent angiography without intervention.   Based on angiography patient was noted to have disease predominantly RCA distribution and based on invasive hemodynamics no coronary interventions were performed.  He was recommended to improve his risk factors.  Patient is accompanied by his wife John Odonnell) at today's office visit.  He denies anginal chest pain or heart failure symptoms.  Overall functional capacity remains stable-walks 2 miles 3 times a week and plays pickle ball/swims/tennis in the summer months.  With lifestyle changes and medical management his lipids have improved results noted below and he has also lost 6 pounds since last office visit.  No family history of premature coronary artery disease or sudden cardiac death. However, he informs me today that his brother who is 66 years old died in the recent past.  They are wanting because of that is still being  investigated but he had multiple confounding factors/risk factors and died in the jail.   ALLERGIES: No Known Allergies  MEDICATION LIST PRIOR TO VISIT: Current Meds  Medication Sig   acetaminophen (TYLENOL) 500 MG tablet Take 1,000 mg by mouth every 6 (six) hours as needed for moderate pain.   atorvastatin (LIPITOR) 80 MG tablet Take 1 tablet (80 mg total) by mouth at bedtime.   clopidogrel (PLAVIX) 75 MG tablet Take 1 tablet (75 mg total) by mouth daily.   hydrochlorothiazide (HYDRODIURIL) 25 MG tablet Take 25 mg by mouth daily.   Ketotifen Fumarate (ITCHY EYE DROPS OP) Place 1 drop into both eyes daily as needed (itchy eyes).   metoprolol succinate (TOPROL-XL) 25 MG 24 hr tablet Take 25 mg by mouth daily.   Multiple Vitamins-Minerals (CENTRUM SILVER 50+MEN PO) Take by mouth.   nitroGLYCERIN (NITROSTAT) 0.4 MG SL tablet Place 1 tablet (0.4 mg total) under the tongue every 5 (five) minutes as needed for chest pain. If you require more than two tablets five minutes apart go to the nearest ER via EMS.   omeprazole (PRILOSEC) 20 MG capsule Take 20 mg by mouth daily.   valsartan (DIOVAN) 320 MG tablet Take 320 mg by mouth at bedtime.   [DISCONTINUED] atorvastatin (LIPITOR) 20 MG tablet Take 20 mg by mouth daily.     PAST MEDICAL HISTORY: Past Medical History:  Diagnosis Date   Agatston coronary artery calcium score greater than 400    Atrial fibrillation (HCC) 2016   TEE put back into rythmn   GERD (gastroesophageal reflux disease)    Hyperlipidemia    Hypertension     PAST  SURGICAL HISTORY: Past Surgical History:  Procedure Laterality Date   ANTERIOR CRUCIATE LIGAMENT REPAIR  10/22/1993   Left knee   COLONOSCOPY     CORONARY ANGIOGRAPHY N/A 11/16/2022   Procedure: CORONARY ANGIOGRAPHY;  Surgeon: Yates Decamp, MD;  Location: [facility] INVASIVE CV [facility];  Service: Cardiovascular;  Laterality: N/A;   CORONARY PRESSURE/FFR STUDY N/A 11/16/2022   Procedure: INTRAVASCULAR PRESSURE WIRE/FFR STUDY;   Surgeon: Yates Decamp, MD;  Location: [facility] INVASIVE CV [facility];  Service: Cardiovascular;  Laterality: N/A;   HERNIA REPAIR     POLYPECTOMY     TONSILLECTOMY AND ADENOIDECTOMY      FAMILY HISTORY: The patient family history includes Bipolar disorder in his brother, mother, and sister; Depression in his brother, mother, and sister; Heart attack in his maternal grandfather and maternal uncle; Heart disease in his maternal uncle and paternal grandfather; Hypertension in his maternal aunt, maternal grandfather, maternal grandmother, maternal uncle, and mother; Mental illness in his maternal grandmother; Schizophrenia in his mother; Skin cancer (age of onset: 60) in his father.  SOCIAL HISTORY:  The patient  reports that he has never smoked. He has never been exposed to tobacco smoke. He has never used smokeless tobacco. He reports current alcohol use of about 2.0 standard drinks of alcohol per week. He reports that he does not use drugs.  REVIEW OF SYSTEMS: Review of Systems  Constitutional: Positive for weight loss.  Cardiovascular:  Negative for chest pain, claudication, dyspnea on exertion, irregular heartbeat, leg swelling, near-syncope, orthopnea, palpitations, paroxysmal nocturnal dyspnea and syncope.  Respiratory:  Negative for shortness of breath.   Hematologic/Lymphatic: Negative for bleeding problem.  Musculoskeletal:  Negative for muscle cramps and myalgias.  Neurological:  Negative for dizziness and light-headedness.    PHYSICAL EXAM:    05/10/2023    2:58 PM 12/04/2022    1:28 PM 11/16/2022    3:53 PM  Vitals with BMI  Height 6\' 0"  6\' 0"    Weight 192 lbs 6 oz 198 lbs 6 oz   BMI 26.09 26.9   Systolic 120 122   Diastolic 71 77   Pulse 60 56 56    Physical Exam  Constitutional: No distress.  Age appropriate, hemodynamically stable.   Neck: No JVD present.  Cardiovascular: Normal rate, regular rhythm, S1 normal, S2 normal, intact distal pulses and normal pulses. Exam reveals no  gallop, no S3 and no S4.  No murmur heard. Pulmonary/Chest: Effort normal and breath sounds normal. No stridor. He has no wheezes. He has no rales.  Abdominal: Soft. Bowel sounds are normal. He exhibits no distension. There is no abdominal tenderness.  Musculoskeletal:        General: No edema.     Cervical back: Neck supple.  Neurological: He is alert and oriented to person, place, and time. He has intact cranial nerves (2-12).  Skin: Skin is warm and moist.   RADIOLOGY: CAC Report 09/17/2022: Total Agatston score: 3490   [place] database percentile: 99   4.5 cm ascending thoracic aortic aneurysm at the aortic root. Recommend semi-annual imaging followup by CTA or MRA and referral to cardiothoracic surgery if not already obtained. This recommendation follows 2010 ACCF/AHA/AATS/ACR/ASA/SCA/SCAI/SIR/STS/SVM Guidelines for the Diagnosis and Management of Patients With Thoracic Aortic Disease. Circulation. 2010; 121: B147-W295. Aortic aneurysm NOS (ICD10-I71.9)  CARDIAC DATABASE: EKG: 10/11/2022: Sinus bradycardia, 51 bpm, without underlying ischemia or injury pattern.   May 10, 2023: Sinus bradycardia, 58 bpm, without underlying ischemia or injury pattern.  Echocardiogram: 06/11/2018: LVEF 60-65%, grade 1 diastolic  dysfunction, trivial AR, mildly dilated left atrium.  11/01/2022:  Normal LV systolic function with visual EF 55-60%. Left ventricle cavity is normal in size. Mild concentric hypertrophy of the left ventricle. Normal global wall motion. Doppler evidence of grade I (impaired) diastolic dysfunction, normal LAP. Calculated EF 59%. Structurally normal mitral valve. Mild (Grade I) mitral regurgitation. Structurally normal tricuspid valve. Mild tricuspid regurgitation. No evidence of pulmonary hypertension. The aortic root is mildly dilated at the SoV measuring 4.2 cm. Compared to 03/2014, there is mild aortic dilatation   Stress Testing: Exercise Myoview stress test  10/30/2022: Exercise nuclear stress test was performed using Bruce protocol.  1 Day Rest and Stress images. Exercise time 8 minutes 52 seconds on Bruce protocol, achieved 10.16 METS, 88% of APMHR.  Stress ECG negative for ischemia.  Small size, moderate intensity, reversible perfusion defect suggestive of ischemia in mid to distal LAD/diagonal distribution. Medium size, mild to moderate intensity, reversible perfusion defect suggestive of ischemia in the RCA/LCx distribution. Calculated LVEF 51%, visually appears preserved. Wall thickness is preserved and no obvious regional wall motion abnormalities. No prior studies available for comparison. Intermediate risk study.   Heart Catheterization: Left Heart Catheterization 11/16/22:  LM: Large-caliber vessel, smooth and normal, mildly calcified. LAD: Large-caliber vessel in the proximal segment, mild diffuse disease in the proximal segment, mild to moderate calcification is also evident especially in the proximal segment with a 30% stenosis.  Mid segment has a long tubular 30% stenosis. RI: Large-caliber vessel.  Smooth and normal. CX: Moderate caliber vessel in proximal segment with mild disease in the proximal segment of 30%.  He was origin to a moderate-sized OM1 and OM 2.  Mild disease is evident in the mid circumflex. RCA: Very large caliber vessel, superdominant, diffuse luminal irregularity in the proximal and mid segment with severe calcification of the proximal to mid to distal segment of the right coronary artery.  There is a large calcific spicule in the mid segment of the RCA giving a 90% stenosis appearance in some views and a 50% appearance in the other views. Pressure wire performed, CFR was 0.95.  Not hemodynamically significant.    Impression:  Moderate coronary artery disease involving the LAD and circumflex, severe calcification of a very large superdominant right coronary artery with a complex mid RCA stenosis which appears to  be 80 to 90% but not hemodynamically significant, suspect medial calcification.   Aggressive risk factor modification is indicated.   Aspirin was discontinued, patient was started on Plavix.   Patient also has slow flow in all the coronary vessels suggestive of microvascular dysfunction    LABORATORY DATA: External Labs: Collected: 06/26/2022 Hemoglobin 13.6, hematocrit 42.8%  Collected 05/24/2022  BUN 18, creatinine 1.1. eGFR 80. Sodium 136, potassium 4.2, chloride 98, AST 23, ALT 17, alkaline phosphatase 72. Total cholesterol 210, triglycerides 130, HDL 46, LDL 141. TSH 2.0  External Labs: Collected: 10/24/2022 provided by the patient. BUN 15, creatinine 1.02. Sodium 138, potassium 4.2, chloride 96, bicarb 24. AST 24, ALT 19, alkaline phosphatase 76. Total cholesterol 195, triglycerides 159, HDL 54, LDL direct 116, non-HDL 113  External Labs: Collected:  11/07/2022  hemoglobin 13.3, hematocrit 39%  BUN 13, creatinine 1.04. Sodium 139, potassium 4.5, chloride 99, bicarb  26,  A1c 5.4  External Labs: Collected: 12/06/2022 Apolipoprotein A1 126 (within normal limits). Apolipoprotein B 76 (within normal limits) LP(a) 118.1 (normal values <75)  External Labs:  Collected 05/08/2023 provided by patient.  BUN 16, Cr 0.99 Na 136, K 4.3,  Cl 94, CO2 24, AST 21, ALT 16  AlK Phos 62 Total cholesterol 160, TAG 100, HDL 50, direct LDL 93 Apolipo A-1 106 and B 87 (w/n normal limits) Lp(a) 102.      Latest Ref Rng & Units 02/17/2014    7:00 AM 02/21/2012   10:02 AM  CBC  WBC 4.0 - 10.5 K/uL 12.7  5.3   Hemoglobin 13.0 - 17.0 g/dL 09.8  11.9   Hematocrit 39.0 - 52.0 % 45.3  44.8   Platelets 150 - 400 K/uL 173  207        Latest Ref Rng & Units 02/17/2014    7:00 AM 07/24/2013   11:45 AM 02/21/2012   10:02 AM  CMP  Glucose 70 - 99 mg/dL 147  72  86   BUN 6 - 23 mg/dL 15  16  15    Creatinine 0.50 - 1.35 mg/dL 8.29  5.62  1.30   Sodium 137 - 147 mEq/L 142  139  139   Potassium 3.7  - 5.3 mEq/L 3.8  4.1  4.2   Chloride 96 - 112 mEq/L 102  101  105   CO2 19 - 32 mEq/L 26  27  25    Calcium 8.4 - 10.5 mg/dL 9.0  9.5  9.3   Total Protein 6.0 - 8.3 g/dL   6.9   Total Bilirubin 0.3 - 1.2 mg/dL   0.9   Alkaline Phos 39 - 117 U/L   62   AST 0 - 37 U/L   19   ALT 0 - 53 U/L   15     Lipid Panel     Component Value Date/Time   CHOL 196 07/24/2013 1145   TRIG 137 07/24/2013 1145   HDL 47 07/24/2013 1145   CHOLHDL 4.2 07/24/2013 1145   VLDL 27 07/24/2013 1145   LDLCALC 122 (H) 07/24/2013 1145    No components found for: "NTPROBNP" No results for input(s): "PROBNP" in the last 8760 hours. No results for input(s): "TSH" in the last 8760 hours.  BMP No results for input(s): "NA", "K", "CL", "CO2", "GLUCOSE", "BUN", "CREATININE", "CALCIUM", "GFRNONAA", "GFRAA" in the last 8760 hours.  HEMOGLOBIN A1C No results found for: "HGBA1C", "MPG"  IMPRESSION:    ICD-10-CM   1. Coronary artery disease involving native coronary artery of native heart without angina pectoris  I25.10 atorvastatin (LIPITOR) 80 MG tablet    Lipid Panel With LDL/HDL Ratio    LDL cholesterol, direct    CMP14+EGFR    Lipoprotein A (LPA)    2. Coronary atherosclerosis due to calcified coronary lesion  I25.10 atorvastatin (LIPITOR) 80 MG tablet   I25.84 Lipid Panel With LDL/HDL Ratio    LDL cholesterol, direct    CMP14+EGFR    Lipoprotein A (LPA)    3. Agatston CAC score, >400  R93.1 atorvastatin (LIPITOR) 80 MG tablet    Lipid Panel With LDL/HDL Ratio    LDL cholesterol, direct    CMP14+EGFR    Lipoprotein A (LPA)    4. Pure hypercholesterolemia  E78.00 atorvastatin (LIPITOR) 80 MG tablet    Lipid Panel With LDL/HDL Ratio    LDL cholesterol, direct    CMP14+EGFR    Lipoprotein A (LPA)    5. Benign hypertension  I10     6. Aortic root aneurysm (HCC)  I71.21          RECOMMENDATIONS: John Odonnell is a 57 y.o. 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   Coronary artery disease without angina pectoris Coronary atherosclerosis due to calcified coronary lesion Agatston CAC score, >400 Total CAC 3490, 99th percentile Denies angina pectoris or heart failure symptoms. Good functional capacity for age as described above.   Lipids as well as weight has improved since last office visit. EKG: Nonischemic. Echo: Preserved LVEF, grade 1 diastolic impairment, mild MR/TR. MPI: Illustrates reversible myocardial perfusion defect in the LAD and LCx/RCA distribution.  Angiography: Nonobstructive disease in the LAD and LCx but obstructive disease in the RCA which is not hemodynamically significant based on invasive hemodynamics. Recommend aggressive management of his cardiovascular risk factors. Recommend LDL goal less than 55 mg/dL. Change Lipitor from 20 mg p.o. nightly to 80 mg p.o. nightly. Repeat fasting lipids in 6 weeks to reevaluate therapy. No use of sublingual nitroglycerin tablets since the last office visit. Educated on seeking medical attention sooner by going to the closest ER via EMS if he develops chest pain or if he has atypical discomfort which increase in intensity, frequency, duration. Patient verbalized understanding.  Pure hypercholesterolemia Hypertriglyceridemia Currently on atorvastatin. Outside labs independently reviewed and noted above for further reference.   Goal LDL < 55 mg/dL Monitor for now  Benign hypertension Office and home blood pressures are very well-controlled. Recommend a goal SBP of 120 mmHg given his dilated aortic root. Reemphasized the importance of low-salt diet  Aortic root aneurysm [facility]) Coronary calcium score November 2023: aortic root to be 45 mm. Echo from 2019 also reported that the root to be 45 mm. As  recommended by radiology we will repeat a CT aorta protocol (gated) in 6 months to reevaluate dimensions, orders for June 2024.  However, study still pending On ARB and Beta Blockers Reemphasized the importance of blood pressure management Also discussed symptoms of aortic syndromes and if present he should go to the closest ER via EMS for further evaluation and management.  FINAL MEDICATION LIST END OF ENCOUNTER: Meds ordered this encounter  Medications   atorvastatin (LIPITOR) 80 MG tablet    Sig: Take 1 tablet (80 mg total) by mouth at bedtime.    Dispense:  90 tablet    Refill:  3    Medications Discontinued During This Encounter  Medication Reason   Multiple Vitamins-Minerals (MULTIVITAMIN ADULTS 50+ PO) Duplicate   atorvastatin (LIPITOR) 20 MG tablet Dose change      Current Outpatient Medications:    acetaminophen (TYLENOL) 500 MG tablet, Take 1,000 mg by mouth every 6 (six) hours as needed for moderate pain., Disp: , Rfl:    atorvastatin (LIPITOR) 80 MG tablet, Take 1 tablet (80 mg total) by mouth at bedtime., Disp: 90 tablet, Rfl: 3   clopidogrel (PLAVIX) 75 MG tablet, Take 1 tablet (75 mg total) by mouth daily., Disp: 90 tablet, Rfl: 3   hydrochlorothiazide (HYDRODIURIL) 25 MG tablet, Take 25 mg by mouth daily., Disp: , Rfl:    Ketotifen Fumarate (ITCHY EYE DROPS OP), Place 1 drop into both eyes daily as needed (itchy eyes)., Disp: , Rfl:    metoprolol succinate (TOPROL-XL) 25 MG 24 hr tablet, Take 25 mg by mouth daily., Disp: , Rfl:    Multiple Vitamins-Minerals (CENTRUM SILVER 50+MEN PO), Take by mouth., Disp: , Rfl:    nitroGLYCERIN (NITROSTAT) 0.4 MG SL tablet, Place 1 tablet (0.4 mg total) under the tongue every 5 (five)  minutes as needed for chest pain. If you require more than two tablets five minutes apart go to the nearest ER via EMS., Disp: 30 tablet, Rfl: 0   omeprazole (PRILOSEC) 20 MG capsule, Take 20 mg by mouth daily., Disp: , Rfl:    valsartan (DIOVAN) 320 MG  tablet, Take 320 mg by mouth at bedtime., Disp: , Rfl:   Orders Placed This Encounter  Procedures   Lipid Panel With LDL/HDL Ratio   LDL cholesterol, direct   [id number]   Lipoprotein A (LPA)    There are no Patient Instructions on file for this visit.   --Continue cardiac medications as reconciled in final medication list. --Return in about 8 weeks (around 07/05/2023) for Follow up, Coronary artery calcification, Review test results. or sooner if needed. --Continue follow-up with your primary care physician regarding the management of your other chronic comorbid conditions.  Patient's questions and concerns were addressed to his satisfaction. He voices understanding of the instructions provided during this encounter.   This note was created using a voice recognition software as a result there may be grammatical errors inadvertently enclosed that do not reflect the nature of this encounter. Every attempt is made to correct such errors.  Tessa Lerner, [place], [facility]  Pager:  [phone] Office: [phone]

## 2023-05-15 ENCOUNTER — Encounter: Payer: Self-pay | Admitting: Cardiology

## 2023-06-17 ENCOUNTER — Other Ambulatory Visit: Payer: Self-pay

## 2023-06-17 DIAGNOSIS — E7800 Pure hypercholesterolemia, unspecified: Secondary | ICD-10-CM

## 2023-06-17 DIAGNOSIS — R931 Abnormal findings on diagnostic imaging of heart and coronary circulation: Secondary | ICD-10-CM

## 2023-06-17 DIAGNOSIS — I2510 Atherosclerotic heart disease of native coronary artery without angina pectoris: Secondary | ICD-10-CM

## 2023-06-19 ENCOUNTER — Other Ambulatory Visit: Payer: Self-pay

## 2023-06-19 DIAGNOSIS — I10 Essential (primary) hypertension: Secondary | ICD-10-CM

## 2023-06-19 DIAGNOSIS — I7121 Aneurysm of the ascending aorta, without rupture: Secondary | ICD-10-CM

## 2023-06-27 ENCOUNTER — Ambulatory Visit (HOSPITAL_COMMUNITY)
Admission: RE | Admit: 2023-06-27 | Discharge: 2023-06-27 | Disposition: A | Discharge status: Home / Self Care | Payer: 59 | Source: Ambulatory Visit | Attending: Cardiology | Admitting: Cardiology

## 2023-06-27 DIAGNOSIS — I10 Essential (primary) hypertension: Secondary | ICD-10-CM | POA: Insufficient documentation

## 2023-06-27 DIAGNOSIS — I7121 Aneurysm of the ascending aorta, without rupture: Secondary | ICD-10-CM | POA: Diagnosis present

## 2023-06-27 MED ORDER — IOHEXOL 350 MG/ML SOLN
75.0000 mL | Freq: Once | INTRAVENOUS | Status: AC | PRN
  Administered 2023-06-27: 75 mL via INTRAVENOUS

## 2023-07-08 ENCOUNTER — Encounter: Payer: Self-pay | Admitting: Cardiology

## 2023-07-08 ENCOUNTER — Other Ambulatory Visit: Payer: Self-pay | Admitting: Family Medicine

## 2023-07-08 ENCOUNTER — Ambulatory Visit: Payer: 59 | Admitting: Cardiology

## 2023-07-08 VITALS — BP 130/82 | HR 49 | Resp 16 | Ht 72.0 in | Wt 199.0 lb

## 2023-07-08 DIAGNOSIS — I7121 Aneurysm of the ascending aorta, without rupture: Secondary | ICD-10-CM

## 2023-07-08 DIAGNOSIS — I10 Essential (primary) hypertension: Secondary | ICD-10-CM

## 2023-07-08 DIAGNOSIS — E7800 Pure hypercholesterolemia, unspecified: Secondary | ICD-10-CM

## 2023-07-08 DIAGNOSIS — E041 Nontoxic single thyroid nodule: Secondary | ICD-10-CM

## 2023-07-08 DIAGNOSIS — R931 Abnormal findings on diagnostic imaging of heart and coronary circulation: Secondary | ICD-10-CM

## 2023-07-08 DIAGNOSIS — Q2543 Congenital aneurysm of aorta: Secondary | ICD-10-CM

## 2023-07-08 DIAGNOSIS — I2510 Atherosclerotic heart disease of native coronary artery without angina pectoris: Secondary | ICD-10-CM

## 2023-07-08 MED ORDER — ATORVASTATIN CALCIUM 40 MG PO TABS: 40.0000 mg | ORAL_TABLET | Freq: Every day | ORAL | Status: DC

## 2023-07-08 MED ORDER — REPATHA SURECLICK 140 MG/ML ~~LOC~~ SOAJ: 140.0000 mg | SUBCUTANEOUS | 2 refills | Status: DC

## 2023-07-08 NOTE — Progress Notes (Signed)
ID:  John Odonnell, DOB May 17, 1966, MRN [id number]  PCP:  Ozella Rocks, MD  Cardiologist:  Tessa Lerner, DO, [facility] (established care 10/11/22)  Date: 07/08/23 Last Office Visit: 05/10/2023  Chief Complaint  Patient presents with   Coronary artery calcification   Results   Follow-up    HPI  John Odonnell is a 57 y.o. 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   Referred to the practice for severe coronary artery calcification and aneurysmal dilatation of the aortic root noted on coronary calcium report.  Known  history of hyperlipidemia for which he chose not to be treated for several years and eventually underwent coronary calcium score and was noted to have a total CAC of 3490, placing him at the 99th percentile.  He underwent functional assessment was noted to have reversible ischemia and later underwent angiography without intervention.   Based on angiography patient was noted to have disease predominantly RCA distribution and based on invasive hemodynamics no coronary interventions were performed.  He was recommended to improve his risk factors.  Patient is accompanied by his wife John Odonnell) at today's office visit.  Since last office visit he denies anginal chest pain or heart failure symptoms.  Overall physical endurance remains stable.  Walks 2 miles at least 3 days a week and plays pickle ball/swims/tennis.  At last office visit Lipitor was increased to 80 mg p.o. nightly and repeat lipids noted improvement in LDL but still not at goal and LP(a) has improved but not within normal limits.  CTA chest with gating notes aortic root size increased from 46 mm to 48 mm.  Noncardiac findings also discussed.  He has an appointment with PCP later today to discuss thyroid  workup  No family history of premature coronary artery disease or sudden cardiac death. However, he informs me today that his brother who is 3 years old died in the recent past.  They are wanting because of that is still being investigated but he had multiple confounding factors/risk factors and died in the jail.   ALLERGIES: No Known Allergies  MEDICATION LIST PRIOR TO VISIT: Current Meds  Medication Sig   acetaminophen (TYLENOL) 500 MG tablet Take 1,000 mg by mouth every 6 (six) hours as needed for moderate pain.   clopidogrel (PLAVIX) 75 MG tablet Take 1 tablet (75 mg total) by mouth daily.   Evolocumab (REPATHA SURECLICK) 140 MG/ML SOAJ Inject 140 mg into the skin every 14 (fourteen) days for 6 doses.   hydrochlorothiazide (HYDRODIURIL) 25 MG tablet Take 25 mg by mouth daily.   Ketotifen Fumarate (ITCHY EYE DROPS OP) Place 1 drop into both eyes daily as needed (itchy eyes).   metoprolol succinate (TOPROL-XL) 25 MG 24 hr tablet Take 25 mg by mouth daily.   Multiple Vitamins-Minerals (CENTRUM SILVER 50+MEN PO) Take by mouth.   omeprazole (PRILOSEC) 20 MG capsule Take 20 mg by mouth daily.   valsartan (DIOVAN) 320 MG tablet Take 320 mg by mouth at bedtime.   [DISCONTINUED] atorvastatin (LIPITOR) 80 MG tablet Take 1 tablet (80 mg total) by mouth at bedtime.     PAST MEDICAL HISTORY: Past Medical History:  Diagnosis Date   Agatston coronary artery calcium score greater than 400    Atrial fibrillation (HCC) 2016   TEE put back into rythmn   GERD (gastroesophageal reflux  disease)    Hyperlipidemia    Hypertension     PAST SURGICAL HISTORY: Past Surgical History:  Procedure Laterality Date   ANTERIOR CRUCIATE LIGAMENT REPAIR  10/22/1993   Left knee   COLONOSCOPY     CORONARY ANGIOGRAPHY N/A 11/16/2022   Procedure: CORONARY ANGIOGRAPHY;  Surgeon: Yates Decamp, MD;  Location: [facility];  Service: Cardiovascular;  Laterality: N/A;   CORONARY PRESSURE/FFR STUDY N/A 11/16/2022    Procedure: INTRAVASCULAR PRESSURE WIRE/FFR STUDY;  Surgeon: Yates Decamp, MD;  Location: [facility];  Service: Cardiovascular;  Laterality: N/A;   HERNIA REPAIR     POLYPECTOMY     TONSILLECTOMY AND ADENOIDECTOMY      FAMILY HISTORY: The patient family history includes Bipolar disorder in his brother, mother, and sister; Depression in his brother, mother, and sister; Heart attack in his maternal grandfather and maternal uncle; Heart disease in his maternal uncle and paternal grandfather; Hypertension in his maternal aunt, maternal grandfather, maternal grandmother, maternal uncle, and mother; Mental illness in his maternal grandmother; Schizophrenia in his mother; Skin cancer (age of onset: 70) in his father.  SOCIAL HISTORY:  The patient  reports that he has never smoked. He has never been exposed to tobacco smoke. He has never used smokeless tobacco. He reports current alcohol use of about 2.0 standard drinks of alcohol per week. He reports that he does not use drugs.  REVIEW OF SYSTEMS: Review of Systems  Cardiovascular:  Negative for chest pain, claudication, dyspnea on exertion, irregular heartbeat, leg swelling, near-syncope, orthopnea, palpitations, paroxysmal nocturnal dyspnea and syncope.  Respiratory:  Negative for shortness of breath.   Hematologic/Lymphatic: Negative for bleeding problem.  Musculoskeletal:  Negative for muscle cramps and myalgias.  Neurological:  Negative for dizziness and light-headedness.    PHYSICAL EXAM:    07/08/2023   11:23 AM 05/10/2023    2:58 PM 12/04/2022    1:28 PM  Vitals with BMI  Height 6\' 0"  6\' 0"  6\' 0"   Weight 199 lbs 192 lbs 6 oz 198 lbs 6 oz  BMI 26.98 26.09 26.9  Systolic 130 120 253  Diastolic 82 71 77  Pulse 49 60 56    Physical Exam  Constitutional: No distress.  Age appropriate, hemodynamically stable.   Neck: No JVD present.  Cardiovascular: Normal rate, regular rhythm, S1 normal, S2 normal, intact distal pulses and  normal pulses. Exam reveals no gallop, no S3 and no S4.  No murmur heard. Pulmonary/Chest: Effort normal and breath sounds normal. No stridor. He has no wheezes. He has no rales.  Abdominal: Soft. Bowel sounds are normal. He exhibits no distension. There is no abdominal tenderness.  Musculoskeletal:        General: No edema.     Cervical back: Neck supple.  Neurological: He is alert and oriented to person, place, and time. He has intact cranial nerves (2-12).  Skin: Skin is warm and moist.   RADIOLOGY: CAC Report 09/17/2022: Total Agatston score: 3490   [place] database percentile: 99   4.5 cm ascending thoracic aortic aneurysm at the aortic root. Recommend semi-annual imaging followup by CTA or MRA and referral to cardiothoracic surgery if not already obtained. This recommendation follows 2010 ACCF/AHA/AATS/ACR/ASA/SCA/SCAI/SIR/STS/SVM Guidelines for the Diagnosis and Management of Patients With Thoracic Aortic Disease. Circulation. 2010; 121: G644-I347. Aortic aneurysm NOS (ICD10-I71.9)  CTA chest aorta with gating: 06/29/2023: 1. Significant ectasia bordering on aneurysmal dilation of the aortic root is again noted. The maximal diameter on today's study is 4.8  cm measured at the sinuses of Valsalva which is slightly enlarged compared to 4.6 cm as measured in November of 2023. 2. Enlarged heterogeneous and likely multinodular thyroid gland. Recommend further evaluation with dedicated thyroid ultrasound. 3. The ascending, transverse and descending thoracic aorta are normal in caliber. No atherosclerotic plaque, dissection or other focal abnormality. 4. Coronary artery atherosclerotic vascular calcifications are present. 5. Two vessel aortic arch. The brachiocephalic and left common carotid artery share a common origin.   CARDIAC DATABASE: EKG: 10/11/2022: Sinus bradycardia, 51 bpm, without underlying ischemia or injury pattern.   May 10, 2023: Sinus bradycardia, 58 bpm, without  underlying ischemia or injury pattern.  Echocardiogram: 06/11/2018: LVEF 60-65%, grade 1 diastolic dysfunction, trivial AR, mildly dilated left atrium.  11/01/2022:  Normal LV systolic function with visual EF 55-60%. Left ventricle cavity is normal in size. Mild concentric hypertrophy of the left ventricle. Normal global wall motion. Doppler evidence of grade I (impaired) diastolic dysfunction, normal LAP. Calculated EF 59%. Structurally normal mitral valve. Mild (Grade I) mitral regurgitation. Structurally normal tricuspid valve. Mild tricuspid regurgitation. No evidence of pulmonary hypertension. The aortic root is mildly dilated at the SoV measuring 4.2 cm. Compared to 03/2014, there is mild aortic dilatation   Stress Testing: Exercise Myoview stress test 10/30/2022: Exercise nuclear stress test was performed using Bruce protocol.  1 Day Rest and Stress images. Exercise time 8 minutes 52 seconds on Bruce protocol, achieved 10.16 METS, 88% of APMHR.  Stress ECG negative for ischemia.  Small size, moderate intensity, reversible perfusion defect suggestive of ischemia in mid to distal LAD/diagonal distribution. Medium size, mild to moderate intensity, reversible perfusion defect suggestive of ischemia in the RCA/LCx distribution. Calculated LVEF 51%, visually appears preserved. Wall thickness is preserved and no obvious regional wall motion abnormalities. No prior studies available for comparison. Intermediate risk study.   Heart Catheterization: Left Heart Catheterization 11/16/22:  LM: Large-caliber vessel, smooth and normal, mildly calcified. LAD: Large-caliber vessel in the proximal segment, mild diffuse disease in the proximal segment, mild to moderate calcification is also evident especially in the proximal segment with a 30% stenosis.  Mid segment has a long tubular 30% stenosis. RI: Large-caliber vessel.  Smooth and normal. CX: Moderate caliber vessel in proximal segment with mild  disease in the proximal segment of 30%.  He was origin to a moderate-sized OM1 and OM 2.  Mild disease is evident in the mid circumflex. RCA: Very large caliber vessel, superdominant, diffuse luminal irregularity in the proximal and mid segment with severe calcification of the proximal to mid to distal segment of the right coronary artery.  There is a large calcific spicule in the mid segment of the RCA giving a 90% stenosis appearance in some views and a 50% appearance in the other views. Pressure wire performed, CFR was 0.95.  Not hemodynamically significant.    Impression:  Moderate coronary artery disease involving the LAD and circumflex, severe calcification of a very large superdominant right coronary artery with a complex mid RCA stenosis which appears to be 80 to 90% but not hemodynamically significant, suspect medial calcification.   Aggressive risk factor modification is indicated.   Aspirin was discontinued, patient was started on Plavix.   Patient also has slow flow in all the coronary vessels suggestive of microvascular dysfunction    LABORATORY DATA: External Labs: Collected: 06/26/2022 Hemoglobin 13.6, hematocrit 42.8%  Collected 05/24/2022  BUN 18, creatinine 1.1. eGFR 80. Sodium 136, potassium 4.2, chloride 98, AST 23, ALT 17, alkaline phosphatase 72.  Total cholesterol 210, triglycerides 130, HDL 46, LDL 141. TSH 2.0  External Labs: Collected: 10/24/2022 provided by the patient. BUN 15, creatinine 1.02. Sodium 138, potassium 4.2, chloride 96, bicarb 24. AST 24, ALT 19, alkaline phosphatase 76. Total cholesterol 195, triglycerides 159, HDL 54, LDL direct 116, non-HDL 113  External Labs: Collected:  11/07/2022  hemoglobin 13.3, hematocrit 39%  BUN 13, creatinine 1.04. Sodium 139, potassium 4.5, chloride 99, bicarb  26,  A1c 5.4  External Labs: Collected: 12/06/2022 Apolipoprotein A1 126 (within normal limits). Apolipoprotein B 76 (within normal limits) LP(a) 118.1  (normal values <75)  External Labs:  Collected 05/08/2023 provided by patient.  BUN 16, Cr 0.99 Na 136, K 4.3, Cl 94, CO2 24, AST 21, ALT 16  AlK Phos 62 Total cholesterol 160, TAG 100, HDL 50, direct LDL 93 Apolipo A-1 106 and B 87 (w/n normal limits) Lp(a) 102.   External Labs: Collected: 06/20/2023 provided by the patient. BUN 13, creatinine 0.98. Sodium 136, potassium 4.3, chloride 98, bicarb 25 AST 25, ALT 21, alkaline phosphatase 68. Total cholesterol 122, triglycerides 71, HDL 50, direct LDL 61, non-HDL 57 LP(a): 74.2      Latest Ref Rng & Units 02/17/2014    7:00 AM 02/21/2012   10:02 AM  CBC  WBC 4.0 - 10.5 K/uL 12.7  5.3   Hemoglobin 13.0 - 17.0 g/dL 59.5  63.8   Hematocrit 39.0 - 52.0 % 45.3  44.8   Platelets 150 - 400 K/uL 173  207        Latest Ref Rng & Units 02/17/2014    7:00 AM 07/24/2013   11:45 AM 02/21/2012   10:02 AM  CMP  Glucose 70 - 99 mg/dL 756  72  86   BUN 6 - 23 mg/dL 15  16  15    Creatinine 0.50 - 1.35 mg/dL 4.33  2.95  1.88   Sodium 137 - 147 mEq/L 142  139  139   Potassium 3.7 - 5.3 mEq/L 3.8  4.1  4.2   Chloride 96 - 112 mEq/L 102  101  105   CO2 19 - 32 mEq/L 26  27  25    Calcium 8.4 - 10.5 mg/dL 9.0  9.5  9.3   Total Protein 6.0 - 8.3 g/dL   6.9   Total Bilirubin 0.3 - 1.2 mg/dL   0.9   Alkaline Phos 39 - 117 U/L   62   AST 0 - 37 U/L   19   ALT 0 - 53 U/L   15     Lipid Panel     Component Value Date/Time   CHOL 196 07/24/2013 1145   TRIG 137 07/24/2013 1145   HDL 47 07/24/2013 1145   CHOLHDL 4.2 07/24/2013 1145   VLDL 27 07/24/2013 1145   LDLCALC 122 (H) 07/24/2013 1145    No components found for: "NTPROBNP" No results for input(s): "PROBNP" in the last 8760 hours. No results for input(s): "TSH" in the last 8760 hours.  BMP No results for input(s): "NA", "K", "CL", "CO2", "GLUCOSE", "BUN", "CREATININE", "CALCIUM", "GFRNONAA", "GFRAA" in the last 8760 hours.  HEMOGLOBIN A1C No results found for: "HGBA1C",  "MPG"  IMPRESSION:    ICD-10-CM   1. Aortic root aneurysm (HCC)  I71.21 ECHOCARDIOGRAM COMPLETE    2. Coronary artery disease involving native coronary artery of native heart without angina pectoris  I25.10 atorvastatin (LIPITOR) 40 MG tablet    Evolocumab (REPATHA SURECLICK) 140 MG/ML SOAJ    3. Coronary  atherosclerosis due to calcified coronary lesion  I25.10 atorvastatin (LIPITOR) 40 MG tablet   I25.84 Evolocumab (REPATHA SURECLICK) 140 MG/ML SOAJ    4. Agatston CAC score, >400  R93.1 atorvastatin (LIPITOR) 40 MG tablet    Evolocumab (REPATHA SURECLICK) 140 MG/ML SOAJ    5. Pure hypercholesterolemia  E78.00 atorvastatin (LIPITOR) 40 MG tablet    Evolocumab (REPATHA SURECLICK) 140 MG/ML SOAJ    Lipid Panel With LDL/HDL Ratio    LDL cholesterol, direct    CMP14+EGFR    Lipoprotein A (LPA)    6. Benign hypertension  I10           RECOMMENDATIONS: Diogo Rydalch is a 57 y.o. 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   Aortic root aneurysm (HCC) Aortic root as of 9/20204 is 48 mm as compared to 46 mm in November 2023. Home and office blood pressures are acceptable. Reemphasized importance of blood pressure management. Patient is aware of avoiding prolonged isometric exercises/weight lifting Currently on appropriate medical therapy. Will order repeat echo in 6 months to reevaluate aortic root dimensions and aortic regurgitation.  Coronary artery disease involving native coronary artery of native heart without angina pectoris Coronary atherosclerosis due to calcified coronary lesion Agatston CAC score, >400 Total CAC 3490, 99th percentile Continue current medical therapy. Up titration of Lipitor to 80 mg p.o. nightly has improved LDL levels and LP(a) but they are  not at goal. Shared decision was to reduce Lipitor to 40 mg p.o. daily and start Repatha. Once he is approved for Repatha he will call the office for medication education/administration. Would like to repeat fasting lipid and CMP in 6 weeks after starting Repatha. Recommend a goal LDL <55 mg/dL and LP(a) levels less than 75. EKG: Nonischemic. Echo: Preserved LVEF, grade 1 diastolic impairment, mild MR/TR. MPI: Illustrates reversible myocardial perfusion defect in the LAD and LCx/RCA distribution.  Angiography: Nonobstructive disease in the LAD and LCx but obstructive disease in the RCA which is not hemodynamically significant based on invasive hemodynamics. Recommend aggressive management of his cardiovascular risk factors.  Pure hypercholesterolemia Hypertriglyceridemia Most recent outside lipids reviewed. Continue atorvastatin at the maximally tolerated dose and start Repatha as discussed above. Monitor for now  Benign hypertension Office blood pressures are acceptable. Home blood pressures are better controlled. Continue current medical therapy.  FINAL MEDICATION LIST END OF ENCOUNTER: Meds ordered this encounter  Medications   atorvastatin (LIPITOR) 40 MG tablet    Sig: Take 1 tablet (40 mg total) by mouth at bedtime.   Evolocumab (REPATHA SURECLICK) 140 MG/ML SOAJ    Sig: Inject 140 mg into the skin every 14 (fourteen) days for 6 doses.    Dispense:  2 mL    Refill:  2    Medications Discontinued During This Encounter  Medication Reason   atorvastatin (LIPITOR) 80 MG tablet Reorder     Current Outpatient Medications:    acetaminophen (TYLENOL) 500 MG tablet, Take 1,000 mg by mouth every 6 (six) hours as needed for moderate pain., Disp: , Rfl:    clopidogrel (PLAVIX) 75 MG tablet, Take 1 tablet (75 mg total) by mouth daily., Disp: 90 tablet, Rfl: 3   Evolocumab (REPATHA SURECLICK) 140 MG/ML SOAJ, Inject 140 mg into the skin every 14 (fourteen) days for 6 doses., Disp: 2  mL, Rfl: 2   hydrochlorothiazide (  HYDRODIURIL) 25 MG tablet, Take 25 mg by mouth daily., Disp: , Rfl:    Ketotifen Fumarate (ITCHY EYE DROPS OP), Place 1 drop into both eyes daily as needed (itchy eyes)., Disp: , Rfl:    metoprolol succinate (TOPROL-XL) 25 MG 24 hr tablet, Take 25 mg by mouth daily., Disp: , Rfl:    Multiple Vitamins-Minerals (CENTRUM SILVER 50+MEN PO), Take by mouth., Disp: , Rfl:    omeprazole (PRILOSEC) 20 MG capsule, Take 20 mg by mouth daily., Disp: , Rfl:    valsartan (DIOVAN) 320 MG tablet, Take 320 mg by mouth at bedtime., Disp: , Rfl:    atorvastatin (LIPITOR) 40 MG tablet, Take 1 tablet (40 mg total) by mouth at bedtime., Disp: , Rfl:    nitroGLYCERIN (NITROSTAT) 0.4 MG SL tablet, Place 1 tablet (0.4 mg total) under the tongue every 5 (five) minutes as needed for chest pain. If you require more than two tablets five minutes apart go to the nearest ER via EMS., Disp: 30 tablet, Rfl: 0  Orders Placed This Encounter  Procedures   Lipid Panel With LDL/HDL Ratio   LDL cholesterol, direct   [id number]   Lipoprotein A (LPA)   ECHOCARDIOGRAM COMPLETE    There are no Patient Instructions on file for this visit.   --Continue cardiac medications as reconciled in final medication list. --Return in about 7 months (around 01/22/2024) for Follow up aortic root dilatation, coronary artery disease, hyperlipidemia. or sooner if needed. --Continue follow-up with your primary care physician regarding the management of your other chronic comorbid conditions.  Patient's questions and concerns were addressed to his satisfaction. He voices understanding of the instructions provided during this encounter.   This note was created using a voice recognition software as a result there may be grammatical errors inadvertently enclosed that do not reflect the nature of this encounter. Every attempt is made to correct such errors.  Tessa Lerner, [place], [facility]  Pager:  [phone] Office:  [phone]

## 2023-07-08 NOTE — Progress Notes (Signed)
Thyroid nodules noted on CTA w/ recommendation per Radiology to obtain US.

## 2023-07-09 ENCOUNTER — Ambulatory Visit
Admission: RE | Admit: 2023-07-09 | Discharge: 2023-07-09 | Disposition: A | Discharge status: Home / Self Care | Payer: 59 | Source: Ambulatory Visit | Attending: Family Medicine | Admitting: Family Medicine

## 2023-07-09 DIAGNOSIS — E041 Nontoxic single thyroid nodule: Secondary | ICD-10-CM

## 2023-07-16 ENCOUNTER — Telehealth: Payer: Self-pay | Admitting: Pharmacy Technician

## 2023-07-16 ENCOUNTER — Telehealth: Payer: Self-pay | Admitting: Cardiology

## 2023-07-16 ENCOUNTER — Other Ambulatory Visit: Payer: Self-pay | Admitting: Family Medicine

## 2023-07-16 ENCOUNTER — Other Ambulatory Visit (HOSPITAL_COMMUNITY): Payer: Self-pay

## 2023-07-16 DIAGNOSIS — E7800 Pure hypercholesterolemia, unspecified: Secondary | ICD-10-CM

## 2023-07-16 DIAGNOSIS — E041 Nontoxic single thyroid nodule: Secondary | ICD-10-CM

## 2023-07-16 DIAGNOSIS — I2510 Atherosclerotic heart disease of native coronary artery without angina pectoris: Secondary | ICD-10-CM

## 2023-07-16 DIAGNOSIS — R931 Abnormal findings on diagnostic imaging of heart and coronary circulation: Secondary | ICD-10-CM

## 2023-07-16 NOTE — Progress Notes (Signed)
Thyroid US w/ 5 nodules noted w/ 2 of which meeting TIRAD score of 3 and 4 and being >2.5cm. FNA recommended. Orders placed.

## 2023-07-16 NOTE — Telephone Encounter (Signed)
Pt c/o medication issue:  1. Name of Medication:   Evolocumab (REPATHA SURECLICK) 140 MG/ML SOAJ    2. How are you currently taking this medication (dosage and times per day)? As written   3. Are you having a reaction (difficulty breathing--STAT)? No   4. What is your medication issue? Pt states his insurance requires prior auth for this medication. Please advise.

## 2023-07-16 NOTE — Telephone Encounter (Signed)
Pharmacy Patient Advocate Encounter   Received notification from Pt Calls Messages that prior authorization for repatha is required/requested.   Insurance verification completed.   The patient is insured through [employer] [facility] .   Per test claim: PA required; PA started via CoverMyMeds. KEY [id number] . Waiting for clinical questions to populate.

## 2023-07-17 ENCOUNTER — Other Ambulatory Visit (HOSPITAL_COMMUNITY): Payer: Self-pay

## 2023-07-17 NOTE — Telephone Encounter (Signed)
Pharmacy Patient Advocate Encounter   Received notification from Pt Calls Messages that prior authorization for repatha is required/requested.   Insurance verification completed.   The patient is insured through [employer] [facility] .   Per test claim: PA required; PA submitted to [employer] [facility] via CoverMyMeds Key/confirmation #/EOC [id number] Status is pending

## 2023-07-18 ENCOUNTER — Other Ambulatory Visit (HOSPITAL_COMMUNITY): Payer: Self-pay

## 2023-07-18 MED ORDER — REPATHA SURECLICK 140 MG/ML ~~LOC~~ SOAJ
140.0000 mg | SUBCUTANEOUS | 1 refills | Status: DC
Start: 2023-07-18 — End: 2023-10-11
  Filled 2023-07-18 – 2023-10-11 (×2): qty 2, 28d supply, fill #0

## 2023-07-18 NOTE — Telephone Encounter (Signed)
Pharmacy Patient Advocate Encounter  Received notification from [facility] that Prior Authorization for repatha has been APPROVED from 07/17/23 to 07/15/24   PA #/Case ID/Reference #: [id number]

## 2023-07-18 NOTE — Addendum Note (Signed)
Addended by: Cheree Ditto on: 07/18/2023 10:38 AM   Modules accepted: Orders

## 2023-08-06 ENCOUNTER — Ambulatory Visit
Admission: RE | Admit: 2023-08-06 | Discharge: 2023-08-06 | Disposition: A | Discharge status: Home / Self Care | Payer: 59 | Source: Ambulatory Visit | Attending: Family Medicine | Admitting: Family Medicine

## 2023-08-06 ENCOUNTER — Other Ambulatory Visit (HOSPITAL_COMMUNITY)
Admission: RE | Admit: 2023-08-06 | Discharge: 2023-08-06 | Disposition: A | Discharge status: Home / Self Care | Payer: 59 | Source: Ambulatory Visit | Attending: Interventional Radiology | Admitting: Interventional Radiology

## 2023-08-06 ENCOUNTER — Other Ambulatory Visit: Payer: Self-pay | Admitting: Family Medicine

## 2023-08-06 DIAGNOSIS — E041 Nontoxic single thyroid nodule: Secondary | ICD-10-CM | POA: Insufficient documentation

## 2023-08-08 LAB — CYTOLOGY - NON PAP

## 2023-08-12 ENCOUNTER — Telehealth: Payer: Self-pay | Admitting: Cardiology

## 2023-08-12 DIAGNOSIS — I2510 Atherosclerotic heart disease of native coronary artery without angina pectoris: Secondary | ICD-10-CM

## 2023-08-12 DIAGNOSIS — R931 Abnormal findings on diagnostic imaging of heart and coronary circulation: Secondary | ICD-10-CM

## 2023-08-12 DIAGNOSIS — E7800 Pure hypercholesterolemia, unspecified: Secondary | ICD-10-CM

## 2023-08-12 MED ORDER — ATORVASTATIN CALCIUM 40 MG PO TABS: 40.0000 mg | ORAL_TABLET | Freq: Every day | ORAL | 3 refills | Status: DC

## 2023-08-12 NOTE — Telephone Encounter (Signed)
*  STAT* If patient is at the pharmacy, call can be transferred to refill team.   1. Which medications need to be refilled? (please list name of each medication and dose if known)   atorvastatin (LIPITOR) 40 MG tablet    2. Which pharmacy/location (including street and city if local pharmacy) is medication to be sent to? [id number] [place], [place] - [address]    3. Do they need a 30 day or 90 day supply? 90 day

## 2023-08-12 NOTE — Telephone Encounter (Signed)
Pt's medication was sent to pt's pharmacy as requested. Confirmation received.  °

## 2023-09-27 ENCOUNTER — Other Ambulatory Visit: Payer: Self-pay | Admitting: Family Medicine

## 2023-09-27 DIAGNOSIS — E041 Nontoxic single thyroid nodule: Secondary | ICD-10-CM

## 2023-09-27 NOTE — Progress Notes (Signed)
Initial thyroid US and FNA performed.  Repeat ordered in 12 mo

## 2023-10-11 ENCOUNTER — Other Ambulatory Visit: Payer: Self-pay | Admitting: Cardiology

## 2023-10-11 ENCOUNTER — Other Ambulatory Visit (HOSPITAL_COMMUNITY): Payer: Self-pay

## 2023-10-11 ENCOUNTER — Other Ambulatory Visit: Payer: Self-pay

## 2023-10-11 DIAGNOSIS — R931 Abnormal findings on diagnostic imaging of heart and coronary circulation: Secondary | ICD-10-CM

## 2023-10-11 DIAGNOSIS — I2510 Atherosclerotic heart disease of native coronary artery without angina pectoris: Secondary | ICD-10-CM

## 2023-10-11 DIAGNOSIS — E7800 Pure hypercholesterolemia, unspecified: Secondary | ICD-10-CM

## 2023-10-11 MED ORDER — REPATHA SURECLICK 140 MG/ML ~~LOC~~ SOAJ: 140.0000 mg | SUBCUTANEOUS | 1 refills | Status: DC

## 2023-10-11 NOTE — Telephone Encounter (Signed)
*  STAT* If patient is at the pharmacy, call can be transferred to refill team.   1. Which medications need to be refilled? (please list name of each medication and dose if known)   Evolocumab (REPATHA SURECLICK) 140 MG/ML SOAJ     2. Would you like to learn more about the convenience, safety, & potential cost savings by using the [facility]? N/A   3. Are you open to using the [facility] (Type [facility]. N/A   4. Which pharmacy/location (including street and city if local pharmacy) is medication to be sent to?  [id number] [place] - [address]     5. Do they need a 30 day or 90 day supply? 90 day  Injection is due Sunday. Does not have any left.

## 2023-11-19 ENCOUNTER — Other Ambulatory Visit: Payer: Self-pay

## 2023-11-19 ENCOUNTER — Other Ambulatory Visit (HOSPITAL_COMMUNITY): Payer: Self-pay

## 2023-11-19 ENCOUNTER — Telehealth: Payer: Self-pay | Admitting: Cardiology

## 2023-11-19 MED ORDER — CLOPIDOGREL BISULFATE 75 MG PO TABS
75.0000 mg | ORAL_TABLET | Freq: Every day | ORAL | 3 refills | Status: DC
  Filled 2023-11-19: qty 30, 30d supply, fill #0

## 2023-11-19 MED ORDER — CLOPIDOGREL BISULFATE 75 MG PO TABS: 75.0000 mg | ORAL_TABLET | Freq: Every day | ORAL | 3 refills | Status: DC

## 2023-11-19 NOTE — Progress Notes (Signed)
Refill for Plavix sent in to pharmacy.

## 2023-11-19 NOTE — Telephone Encounter (Signed)
Pt c/o medication issue:  1. Name of Medication: clopidogrel (PLAVIX) 75 MG tablet (Expired)   2. How are you currently taking this medication (dosage and times per day)?    3. Are you having a reaction (difficulty breathing--STAT)? no  4. What is your medication issue? Patient was calling to get a refill on this medication but it wasn't listed on current medication list. Please advise

## 2023-11-19 NOTE — Telephone Encounter (Signed)
Spoke with pt and let him know that a refill for Plavix has been sent to his pharmacy. Pt verbalized understanding and had no further questions or concerns at this time.

## 2023-11-20 ENCOUNTER — Other Ambulatory Visit: Payer: Self-pay

## 2023-11-20 ENCOUNTER — Telehealth: Payer: Self-pay | Admitting: Cardiology

## 2023-11-20 DIAGNOSIS — E7800 Pure hypercholesterolemia, unspecified: Secondary | ICD-10-CM

## 2023-11-20 DIAGNOSIS — I2510 Atherosclerotic heart disease of native coronary artery without angina pectoris: Secondary | ICD-10-CM

## 2023-11-20 DIAGNOSIS — R931 Abnormal findings on diagnostic imaging of heart and coronary circulation: Secondary | ICD-10-CM

## 2023-11-20 MED ORDER — ATORVASTATIN CALCIUM 40 MG PO TABS: 40.0000 mg | ORAL_TABLET | Freq: Every day | ORAL | 2 refills | Status: DC

## 2023-11-20 NOTE — Telephone Encounter (Signed)
Pt wants to have labs done at PCP office. Please fax orders to [phone]

## 2023-11-20 NOTE — Telephone Encounter (Signed)
Lab order printed and faxed to PCP office

## 2023-12-27 ENCOUNTER — Ambulatory Visit (HOSPITAL_COMMUNITY): Payer: 59 | Attending: Cardiology

## 2023-12-27 DIAGNOSIS — Q2543 Congenital aneurysm of aorta: Secondary | ICD-10-CM | POA: Insufficient documentation

## 2023-12-27 DIAGNOSIS — I351 Nonrheumatic aortic (valve) insufficiency: Secondary | ICD-10-CM

## 2023-12-27 LAB — ECHOCARDIOGRAM COMPLETE
AV Vena cont: 0.3 cm
Area-P 1/2: 3.42 cm2
P 1/2 time: 549 ms
S' Lateral: 3.2 cm

## 2024-01-30 LAB — LAB REPORT - SCANNED: EGFR: 78

## 2024-02-03 ENCOUNTER — Encounter: Payer: Self-pay | Admitting: Cardiology

## 2024-02-03 ENCOUNTER — Ambulatory Visit: Payer: 59 | Attending: Cardiology | Admitting: Cardiology

## 2024-02-03 VITALS — BP 120/84 | HR 52 | Resp 16 | Ht 72.0 in | Wt 203.4 lb

## 2024-02-03 DIAGNOSIS — I10 Essential (primary) hypertension: Secondary | ICD-10-CM

## 2024-02-03 DIAGNOSIS — I2584 Coronary atherosclerosis due to calcified coronary lesion: Secondary | ICD-10-CM

## 2024-02-03 DIAGNOSIS — I2510 Atherosclerotic heart disease of native coronary artery without angina pectoris: Secondary | ICD-10-CM

## 2024-02-03 DIAGNOSIS — R931 Abnormal findings on diagnostic imaging of heart and coronary circulation: Secondary | ICD-10-CM

## 2024-02-03 DIAGNOSIS — Q2543 Congenital aneurysm of aorta: Secondary | ICD-10-CM

## 2024-02-03 DIAGNOSIS — E7800 Pure hypercholesterolemia, unspecified: Secondary | ICD-10-CM

## 2024-02-03 NOTE — Patient Instructions (Addendum)
 Medication Instructions:  Your physician recommends that you continue on your current medications as directed. Please refer to the Current Medication list given to you today.  **HOLD hydrochlorothiazide 3 days AFTER your CT Angio Chest scan is completed**  *If you need a refill on your cardiac medications before your next appointment, please call your pharmacy*  Lab Work: To be completed 1 week prior to your CT Angio Chest scan: BMP  If you have labs (blood work) drawn today and your tests are completely normal, you will receive your results only by: MyChart Message (if you have MyChart) OR A paper copy in the mail If you have any lab test that is abnormal or we need to change your treatment, we will call you to review the results.  Testing/Procedures: Non-Cardiac CT Angiography (CTA), is a special type of CT scan that uses a computer to produce multi-dimensional views of major blood vessels throughout the body. In CT angiography, a contrast material is injected through an IV to help visualize the blood vessels   Follow-Up: At [facility], you and your health needs are our priority.  As part of our continuing mission to provide you with exceptional heart care, we have created designated Provider Care Teams.  These Care Teams include your primary Cardiologist (physician) and Advanced Practice Providers (APPs -  Physician Assistants and Nurse Practitioners) who all work together to provide you with the care you need, when you need it.  Your next appointment:   October 2025 (after CT scan is completed)  The format for your next appointment:   In Person  Provider:   Olinda Bertrand, DO{  Other Instructions     1st Floor: - Lobby - Registration  - Pharmacy  - Lab - Cafe  2nd Floor: - PV Lab - Diagnostic Testing (echo, CT, nuclear med)  3rd Floor: - Vacant  4th Floor: - TCTS (cardiothoracic surgery) - AFib Clinic - Structural Heart Clinic - Vascular Surgery  - Vascular  Ultrasound  5th Floor: - HeartCare Cardiology (general and EP) - Clinical Pharmacy for coumadin, hypertension, lipid, weight-loss medications, and med management appointments    Valet parking services will be available as well.

## 2024-02-03 NOTE — Progress Notes (Signed)
 Cardiology Office Note:  .   Date:  02/03/2024  ID:  John Odonnell, DOB 07-24-1966, MRN [id number] PCP:  Ozella Rocks, MD  Former Cardiology Providers: none [facility] Providers Cardiologist:  None , [facility] (established care 10/11/2022) Electrophysiologist:  None  Click to update primary MD,subspecialty MD or APP then REFRESH:1}    Chief Complaint  Patient presents with   Coronary artery disease involving native coronary artery of   Follow-up    History of Present Illness: .   John Odonnell is a 58 y.o. 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   Initially referred to the practice for management of severe coronary artery calcification and aneurysmal dilatation of the aortic root noted on coronary calcium report.  Known history of hyperlipidemia but chose not to be on lipid-lowering agents for several years and eventually underwent coronary calcification scoring for further risk stratification.  Noted to have severe CAC with a total score of 3490 placing him at the 99th percentile.  He underwent functional assessment which noted concern for possible reversible ischemia and later underwent left heart catheterization results mentioned below.  Since his left heart catheterization we have been focusing on improving his modifiable cardiovascular risk factors and also monitoring his aortic root size.  He is currently on Lipitor as well as Repatha.  His office blood pressures in the past been well-controlled and the most recent CTA chest with gating notes aortic root size increased from 46 mm to 48 mm.  Patient presents today for 56-month follow-up visit.  Overall doing well from the last office visit.  Denies anginal chest pain.  Patient states that when he  goes out for a hike he usually gets short of breath when he starts hiking but as he progresses the shortness of breath resolves.  No change in physical endurance.  No near syncope or syncopal events.  No pain between the shoulder blades or symptoms to suggest aortic syndrome.  Review of Systems: .   Review of Systems  Cardiovascular:  Positive for dyspnea on exertion (random, short lived, see HPI). Negative for chest pain, claudication, irregular heartbeat, leg swelling, near-syncope, orthopnea, palpitations, paroxysmal nocturnal dyspnea and syncope.  Respiratory:  Negative for shortness of breath.   Hematologic/Lymphatic: Negative for bleeding problem.    Studies Reviewed:   EKG: EKG Interpretation Date/Time:  Monday February 03 2024 08:09:50 EDT Ventricular Rate:  54 PR Interval:  156 QRS Duration:  82 QT Interval:  432 QTC Calculation: 409 R Axis:   71  Text Interpretation: Sinus bradycardia When compared with ECG of 16-Nov-2022 13:36, No significant change was found Confirmed by Tessa Lerner [phone]) on 02/03/2024 8:20:06 AM  Echocardiogram: 06/11/2018: LVEF 60-65%, grade 1 diastolic dysfunction, trivial AR, mildly dilated left atrium.   11/01/2022: LVEF 55 to 60%, mild LVH, grade 1 diastolic dysfunction, mild MR, mild TR, aortic root sinus of Valsalva 42 mm  12/27/2023   1. Left ventricular ejection fraction, by estimation, is 55 to 60%. The  left ventricle has normal function. The left ventricle has no regional  wall motion abnormalities. Left ventricular diastolic parameters are  consistent with Grade II diastolic  dysfunction (pseudonormalization). The average left ventricular global  longitudinal strain is -23.3 %. The global longitudinal strain is normal.   2. Right ventricular systolic function is normal. The  right ventricular  size is normal. The estimated right ventricular systolic pressure is 21.3  mmHg.   3. Left atrial size was mildly dilated.   4. The mitral valve is  normal in structure. Trivial mitral valve  regurgitation. No evidence of mitral stenosis.   5. The aortic valve is normal in structure. Aortic valve regurgitation is  mild. Aortic valve sclerosis/calcification is present, without any  evidence of aortic stenosis. Aortic regurgitation PHT measures 549 msec.   6. Aortic dilatation noted. Aneurysm of the aortic root, measuring 46 mm.   7. The inferior vena cava is normal in size with greater than 50%  respiratory variability, suggesting right atrial pressure of 3 mmHg.   8. Cannot exclude a small PFO (suggustive in the PSAX view). Recommend  agitated saline contrast study.   Stress Testing: Exercise Myoview stress test 10/30/2022: Intermediate risk study.  See results  Heart Catheterization: Left Heart Catheterization 11/16/22:  LM: Large-caliber vessel, smooth and normal, mildly calcified. LAD: Large-caliber vessel in the proximal segment, mild diffuse disease in the proximal segment, mild to moderate calcification is also evident especially in the proximal segment with a 30% stenosis.  Mid segment has a long tubular 30% stenosis. RI: Large-caliber vessel.  Smooth and normal. CX: Moderate caliber vessel in proximal segment with mild disease in the proximal segment of 30%.  He was origin to a moderate-sized OM1 and OM 2.  Mild disease is evident in the mid circumflex. RCA: Very large caliber vessel, superdominant, diffuse luminal irregularity in the proximal and mid segment with severe calcification of the proximal to mid to distal segment of the right coronary artery.  There is a large calcific spicule in the mid segment of the RCA giving a 90% stenosis appearance in some views and a 50% appearance in the other views. Pressure wire performed, CFR was 0.95.  Not hemodynamically significant.      Impression:  Moderate coronary artery disease involving the LAD and circumflex, severe calcification of a very large superdominant right coronary  artery with a complex mid RCA stenosis which appears to be 80 to 90% but not hemodynamically significant, suspect medial calcification.   Aggressive risk factor modification is indicated.   Aspirin was discontinued, patient was started on Plavix.   Patient also has slow flow in all the coronary vessels suggestive of microvascular dysfunction   RADIOLOGY: NA  Risk Assessment/Calculations:   NA   Labs:    External Labs: Collected: 06/26/2022 Hemoglobin 13.6, hematocrit 42.8%   Collected 05/24/2022  BUN 18, creatinine 1.1. eGFR 80. Sodium 136, potassium 4.2, chloride 98, AST 23, ALT 17, alkaline phosphatase 72. Total cholesterol 210, triglycerides 130, HDL 46, LDL 141. TSH 2.0   External Labs: Collected: 10/24/2022 provided by the patient. BUN 15, creatinine 1.02. Sodium 138, potassium 4.2, chloride 96, bicarb 24. AST 24, ALT 19, alkaline phosphatase 76. Total cholesterol 195, triglycerides 159, HDL 54, LDL direct 116, non-HDL 113   External Labs: Collected:  11/07/2022  hemoglobin 13.3, hematocrit 39%  BUN 13, creatinine 1.04. Sodium 139, potassium 4.5, chloride 99, bicarb  26,  A1c 5.4   External Labs: Collected: 12/06/2022 Apolipoprotein A1 126 (within normal limits). Apolipoprotein B 76 (within normal limits) LP(a) 118.1 (normal values <75)   External Labs:  Collected 05/08/2023 provided by patient.  BUN 16, Cr 0.99 Na 136, K 4.3, Cl 94, CO2 24, AST 21, ALT 16  AlK Phos 62 Total cholesterol 160, TAG 100, HDL 50, direct LDL 93 Apolipo A-1 106 and  B 87 (w/n normal limits) Lp(a) 102.    External Labs: Collected: 06/20/2023 provided by the patient. BUN 13, creatinine 0.98. Sodium 136, potassium 4.3, chloride 98, bicarb 25 AST 25, ALT 21, alkaline phosphatase 68. Total cholesterol 122, triglycerides 71, HDL 50, direct LDL 61, non-HDL 57 LP(a): 86.5  External Labs: Collected: January 30, 2024 [facility] database. BUN 15, creatinine 1.12. AST ALT and alkaline phosphatase  within normal limits. Total cholesterol 87, triglycerides 167, HDL 50, LDL calculated 10, direct LDL 17  Physical Exam:    Today's Vitals   02/03/24 0806  BP: 120/84  Pulse: (!) 52  Resp: 16  SpO2: 96%  Weight: 203 lb 6.4 oz (92.3 kg)  Height: 6' (1.829 m)   Body mass index is 27.59 kg/m. Wt Readings from Last 3 Encounters:  02/03/24 203 lb 6.4 oz (92.3 kg)  07/08/23 199 lb (90.3 kg)  05/10/23 192 lb 6.4 oz (87.3 kg)    Physical Exam  Constitutional: No distress.  hemodynamically stable  Neck: No JVD present.  Cardiovascular: Regular rhythm, S1 normal and S2 normal. Bradycardia present. Exam reveals no gallop, no S3 and no S4.  No murmur heard. Pulmonary/Chest: Effort normal and breath sounds normal. No stridor. He has no wheezes. He has no rales.  Musculoskeletal:        General: No edema.     Cervical back: Neck supple.  Skin: Skin is warm.     Impression & Recommendation(s):  Impression:   ICD-10-CM   1. Coronary artery disease involving native coronary artery of native heart without angina pectoris  I25.10 EKG 12-Lead    2. Coronary atherosclerosis due to calcified coronary lesion  I25.10    I25.84     3. Agatston CAC score, >400  R93.1     4. Pure hypercholesterolemia  E78.00     5. Aortic root aneurysm  Q25.43     6. Benign hypertension  I10        Recommendation(s):  Coronary artery disease involving native coronary artery of native heart without angina pectoris Coronary atherosclerosis due to calcified coronary lesion Agatston CAC score, >400 Total CAC 3490, 99th percentile  Continue antiplatelet therapy and lipid-lowering agents. Most recent lipid profile from April 2025 independently reviewed-LDL is at goal, triglycerides are not well-controlled.  Secondary to dietary indiscretion. EKG is nonischemic. Echocardiogram preserved LVEF, grade 1 diastolic dysfunction, mild TR and MR Angiography: Nonobstructive disease in the LAD and LCx but  obstructive disease in the RCA which is not hemodynamically significant based on invasive hemodynamics. Recommend aggressive management of his cardiovascular risk factors.  Pure hypercholesterolemia Continue Lipitor 40 mg p.o. nightly. Continue Repatha 140 mg every 14 days. Most recent lipid profile independently reviewed LDL is currently at goal, triglycerides are not at goal. Continue current medical therapy.  If LDL continues to trend down we will reduce the dose of Lipitor. Monitor for now  Aortic root aneurysm Chronic and stable. Incidentally noted on coronary calcium score. Recent echocardiogram from March 2025 noted root to be 46 mm. Blood pressures are well-controlled. Reemphasized importance of blood pressure management. Patient is aware of avoiding prolonged isometric exercises/weight lifting Schedule follow-up 6 months CTA aorta to further evaluate/surveillance of aortic root and ascending aorta. After his CTA recommended to hold HCTZ for 3 days to prevent CIN.  Benign hypertension Office blood pressures are well-controlled. Continue hydrochlorothiazide 50 mg p.o. every morning. Continue valsartan 320 mg p.o. every afternoon. Continue Toprol-XL 25 mg p.o. daily   Orders Placed:  Orders Placed This Encounter  Procedures   EKG 12-Lead     Final Medication List:   No orders of the defined types were placed in this encounter.   There are no discontinued medications.   Current Outpatient Medications:    acetaminophen (TYLENOL) 500 MG tablet, Take 1,000 mg by mouth every 6 (six) hours as needed for moderate pain., Disp: , Rfl:    atorvastatin (LIPITOR) 40 MG tablet, Take 1 tablet (40 mg total) by mouth at bedtime., Disp: 90 tablet, Rfl: 2   Cholecalciferol (VITAMIN D3) 50 MCG (2000 UT) CHEW, Chew 1 capsule by mouth daily., Disp: , Rfl:    clopidogrel (PLAVIX) 75 MG tablet, Take 1 tablet (75 mg total) by mouth daily., Disp: 90 tablet, Rfl: 3   Cyanocobalamin (HM SUPER  VITAMIN B12) 2500 MCG CHEW, Chew 1 capsule by mouth daily., Disp: , Rfl:    Evolocumab (REPATHA SURECLICK) 140 MG/ML SOAJ, Inject 140 mg into the skin every 14 (fourteen) days., Disp: 6 mL, Rfl: 1   hydrochlorothiazide (HYDRODIURIL) 50 MG tablet, Take 50 mg by mouth daily., Disp: , Rfl:    Ketotifen Fumarate (ITCHY EYE DROPS OP), Place 1 drop into both eyes daily as needed (itchy eyes)., Disp: , Rfl:    metoprolol succinate (TOPROL-XL) 25 MG 24 hr tablet, Take 25 mg by mouth daily., Disp: , Rfl:    Multiple Vitamins-Minerals (CENTRUM SILVER 50+MEN PO), Take by mouth., Disp: , Rfl:    omeprazole (PRILOSEC) 20 MG capsule, Take 20 mg by mouth daily., Disp: , Rfl:    valsartan (DIOVAN) 320 MG tablet, Take 320 mg by mouth at bedtime., Disp: , Rfl:    nitroGLYCERIN (NITROSTAT) 0.4 MG SL tablet, Place 1 tablet (0.4 mg total) under the tongue every 5 (five) minutes as needed for chest pain. If you require more than two tablets five minutes apart go to the nearest ER via EMS., Disp: 30 tablet, Rfl: 0  Consent:   NA  Disposition:   72-month follow-up after CTA aorta.  His questions and concerns were addressed to his satisfaction. He voices understanding of the recommendations provided during this encounter.    Signed, Olinda Bertrand, DO, [facility]  [facility]  [address] [place], [place] [address] 02/03/2024 8:20 AM

## 2024-03-23 ENCOUNTER — Other Ambulatory Visit: Payer: Self-pay | Admitting: Pharmacist Clinician (PhC)/ Clinical Pharmacy Specialist

## 2024-03-23 DIAGNOSIS — I2510 Atherosclerotic heart disease of native coronary artery without angina pectoris: Secondary | ICD-10-CM

## 2024-03-23 DIAGNOSIS — E7800 Pure hypercholesterolemia, unspecified: Secondary | ICD-10-CM

## 2024-03-23 DIAGNOSIS — R931 Abnormal findings on diagnostic imaging of heart and coronary circulation: Secondary | ICD-10-CM

## 2024-03-23 MED ORDER — REPATHA SURECLICK 140 MG/ML ~~LOC~~ SOAJ: 140.0000 mg | SUBCUTANEOUS | 3 refills | Status: DC

## 2024-06-05 ENCOUNTER — Other Ambulatory Visit (HOSPITAL_COMMUNITY)

## 2024-06-05 ENCOUNTER — Ambulatory Visit (HOSPITAL_COMMUNITY)
Admission: RE | Admit: 2024-06-05 | Discharge: 2024-06-05 | Disposition: A | Discharge status: Home / Self Care | Source: Ambulatory Visit | Attending: Cardiology | Admitting: Cardiology

## 2024-06-05 DIAGNOSIS — I2510 Atherosclerotic heart disease of native coronary artery without angina pectoris: Secondary | ICD-10-CM | POA: Diagnosis not present

## 2024-06-05 DIAGNOSIS — E042 Nontoxic multinodular goiter: Secondary | ICD-10-CM | POA: Insufficient documentation

## 2024-06-05 DIAGNOSIS — Q2543 Congenital aneurysm of aorta: Secondary | ICD-10-CM | POA: Insufficient documentation

## 2024-06-05 DIAGNOSIS — I2584 Coronary atherosclerosis due to calcified coronary lesion: Secondary | ICD-10-CM | POA: Insufficient documentation

## 2024-06-05 DIAGNOSIS — I10 Essential (primary) hypertension: Secondary | ICD-10-CM | POA: Insufficient documentation

## 2024-06-05 MED ORDER — IOHEXOL 350 MG/ML SOLN
75.0000 mL | Freq: Once | INTRAVENOUS | Status: AC | PRN
  Administered 2024-06-05: 75 mL via INTRAVENOUS

## 2024-06-16 ENCOUNTER — Telehealth: Payer: Self-pay | Admitting: Pharmacy Technician

## 2024-06-16 ENCOUNTER — Other Ambulatory Visit (HOSPITAL_COMMUNITY): Payer: Self-pay

## 2024-06-16 NOTE — Telephone Encounter (Signed)
 Pharmacy Patient Advocate Encounter   Received notification from Onbase that prior authorization for REPATHA  is required/requested.   Insurance verification completed.   The patient is insured through [facility] .   Per test claim: PA required; PA submitted to above mentioned insurance via Latent Key/confirmation #/EOC [id number] Status is pending   Received pa form from the insurance but no pa needed

## 2024-06-27 ENCOUNTER — Ambulatory Visit: Payer: Self-pay | Admitting: Cardiology

## 2024-06-27 DIAGNOSIS — Q2543 Congenital aneurysm of aorta: Secondary | ICD-10-CM

## 2024-07-16 ENCOUNTER — Ambulatory Visit
Admission: RE | Admit: 2024-07-16 | Discharge: 2024-07-16 | Disposition: A | Discharge status: Home / Self Care | Source: Ambulatory Visit | Attending: Family Medicine | Admitting: Family Medicine

## 2024-07-16 DIAGNOSIS — E041 Nontoxic single thyroid nodule: Secondary | ICD-10-CM

## 2024-07-27 ENCOUNTER — Telehealth: Payer: Self-pay | Admitting: Cardiology

## 2024-07-27 DIAGNOSIS — E7800 Pure hypercholesterolemia, unspecified: Secondary | ICD-10-CM

## 2024-07-27 DIAGNOSIS — I10 Essential (primary) hypertension: Secondary | ICD-10-CM

## 2024-07-27 NOTE — Telephone Encounter (Signed)
 Please order CMP and fasting Lipid and Lpa if not done in the recent past.   Madonna Large, DO, FACC

## 2024-07-27 NOTE — Telephone Encounter (Signed)
 Patient would like to know if he needs lab work prior to 10/010 appointment. Please advise.

## 2024-07-28 ENCOUNTER — Telehealth: Payer: Self-pay | Admitting: Cardiology

## 2024-07-28 NOTE — Telephone Encounter (Signed)
 Received incoming call from patient. Advised patient that per Dr. Michele patient should have CMP, fasting lipid panel and Lpa labs done. Lab orders placed. Advised patient he can go to any [facility] location to have the labs drawn. Patient verbalized understanding and agreeable to plan.

## 2024-07-28 NOTE — Telephone Encounter (Signed)
 Attempted to contact patient. Left message to call back on personal voicemail.

## 2024-07-28 NOTE — Telephone Encounter (Signed)
 Called and spoke with patient. Patient requested that lab orders be faxed to his PCP Dr. Alm Sizer office at the [facility] office location because he plans to have labs drawn there at his appointment today at 10:15 AM. Patient provided fax number [phone]) and advised to send to the ATTN of Faith Todd. Lab requests were routed to Dr. Alm Heads to the ATTN of Gaylan Boas.

## 2024-07-28 NOTE — Telephone Encounter (Signed)
 Patient ask that we fax his lab request to his PCP office as well as place it in his mychart. Stated he will have his lab drawn there. Please advise

## 2024-07-30 LAB — COMPREHENSIVE METABOLIC PANEL WITH GFR: EGFR: 92

## 2024-07-31 ENCOUNTER — Encounter: Payer: Self-pay | Admitting: Cardiology

## 2024-07-31 ENCOUNTER — Ambulatory Visit: Attending: Cardiology | Admitting: Cardiology

## 2024-07-31 VITALS — BP 130/83 | HR 57 | Ht 72.0 in | Wt 200.4 lb

## 2024-07-31 DIAGNOSIS — E7800 Pure hypercholesterolemia, unspecified: Secondary | ICD-10-CM | POA: Diagnosis not present

## 2024-07-31 DIAGNOSIS — R931 Abnormal findings on diagnostic imaging of heart and coronary circulation: Secondary | ICD-10-CM

## 2024-07-31 DIAGNOSIS — I2510 Atherosclerotic heart disease of native coronary artery without angina pectoris: Secondary | ICD-10-CM

## 2024-07-31 DIAGNOSIS — I351 Nonrheumatic aortic (valve) insufficiency: Secondary | ICD-10-CM

## 2024-07-31 DIAGNOSIS — I77810 Thoracic aortic ectasia: Secondary | ICD-10-CM | POA: Diagnosis not present

## 2024-07-31 DIAGNOSIS — I2584 Coronary atherosclerosis due to calcified coronary lesion: Secondary | ICD-10-CM

## 2024-07-31 DIAGNOSIS — I10 Essential (primary) hypertension: Secondary | ICD-10-CM

## 2024-07-31 MED ORDER — ATORVASTATIN CALCIUM 20 MG PO TABS: 20.0000 mg | ORAL_TABLET | Freq: Every day | ORAL | 3 refills | Status: AC

## 2024-07-31 MED ORDER — REPATHA SURECLICK 140 MG/ML ~~LOC~~ SOAJ: 140.0000 mg | SUBCUTANEOUS | 3 refills | Status: AC

## 2024-07-31 MED ORDER — VALSARTAN 320 MG PO TABS: 320.0000 mg | ORAL_TABLET | Freq: Every day | ORAL | 3 refills | Status: AC

## 2024-07-31 NOTE — Patient Instructions (Signed)
 Medication Instructions:  Refills placed yoday DECREASE Lipitor to 20 mg   *If you need a refill on your cardiac medications before your next appointment, please call your pharmacy*  Lab Work IN 01/2025: FASTING LIPID PANEL  CBC  If you have labs (blood work) drawn today and your tests are completely normal, you will receive your results only by: MyChart Message (if you have MyChart) OR A paper copy in the mail If you have any lab test that is abnormal or we need to change your treatment, we will call you to review the results.  Testing/Procedures: ECHOCARDIOGRAM IN 01/2025  Your physician has requested that you have an echocardiogram. Echocardiography is a painless test that uses sound waves to create images of your heart. It provides your doctor with information about the size and shape of your heart and how well your heart's chambers and valves are working. This procedure takes approximately one hour. There are no restrictions for this procedure. Please do NOT wear cologne, perfume, aftershave, or lotions (deodorant is allowed). Please arrive 15 minutes prior to your appointment time.  Please note: We ask at that you not bring children with you during ultrasound (echo/ vascular) testing. Due to room size and safety concerns, children are not allowed in the ultrasound rooms during exams. Our front office staff cannot provide observation of children in our lobby area while testing is being conducted. An adult accompanying a patient to their appointment will only be allowed in the ultrasound room at the discretion of the ultrasound technician under special circumstances. We apologize for any inconvenience.   Follow-Up: At [facility], you and your health needs are our priority.  As part of our continuing mission to provide you with exceptional heart care, our providers are all part of one team.  This team includes your primary Cardiologist (physician) and Advanced Practice Providers  or APPs (Physician Assistants and Nurse Practitioners) who all work together to provide you with the care you need, when you need it.  Your next appointment:   02/2025   Provider:   Madonna Large, DO

## 2024-07-31 NOTE — Progress Notes (Signed)
 Cardiology Office Note:  .   Date:  07/31/2024  ID:  John Odonnell, DOB 1966/05/04, MRN [id number] PCP:  Remonia Alm PARAS, MD  Former Cardiology Providers: none [facility] Providers Cardiologist:  Madonna Large, DO , [facility] (established care 10/11/2022) Electrophysiologist:  None  Click to update primary MD,subspecialty MD or APP then REFRESH:1}    Chief Complaint  Patient presents with   Follow-up    Aortic root dilatation and CAD    History of Present Illness: .   John Odonnell is a 58 y.o. 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   Initially referred to the practice for management of severe coronary artery calcification and aneurysmal dilatation of the aortic root noted on coronary calcium  report.  Known history of hyperlipidemia but chose not to be on lipid-lowering agents for several years and eventually underwent coronary calcification scoring and was noted to have severe CAC with a total score of 3490 placing him at the 99th percentile.  He underwent functional assessment which noted concern for possible reversible ischemia and later underwent left heart catheterization results mentioned below.  Since his left heart catheterization we have been focusing on improving his modifiable cardiovascular risk factors and also monitoring his aortic root size.  He is currently on Lipitor as well as Repatha .  His office blood pressures in the past been well-controlled and the most recent CTA chest with gating notes aortic root size around 46-52mm.   Patient presents today for follow-up.  Patient denies any anginal chest pain or heart failure symptoms. Since last 6 months patient states that he had mild chest discomfort and tried 1 sublingual  nitroglycerin  tablet with no significant improvement. Overall function capacity remains relatively stable.  He goes to the gym and also walks every other day for 30 minutes. Requests refill on valsartan Outside labs provided by the patient, independently reviewed and mentioned below  Review of Systems: .   Review of Systems  Cardiovascular:  Negative for chest pain, claudication, irregular heartbeat, leg swelling, near-syncope, orthopnea, palpitations, paroxysmal nocturnal dyspnea and syncope.  Respiratory:  Negative for shortness of breath.   Hematologic/Lymphatic: Negative for bleeding problem.    Studies Reviewed:    Echocardiogram: 06/11/2018: LVEF 60-65%, grade 1 diastolic dysfunction, trivial AR, mildly dilated left atrium.   11/01/2022: LVEF 55 to 60%, mild LVH, grade 1 diastolic dysfunction, mild MR, mild TR, aortic root sinus of Valsalva 42 mm  12/27/2023: LVEF 55 to 60%, grade 2 diastolic dysfunction, average GLS -23%, right ventricular size and function normal, mild LAE, mild AR, aortic root 46 mm, cannot exclude small PFO  Stress Testing: Exercise Myoview stress test 10/30/2022: Intermediate risk study.  See results  Heart Catheterization: Left Heart Catheterization 11/16/22:  LM: Large-caliber vessel, smooth and normal, mildly calcified. LAD: Large-caliber vessel in the proximal segment, mild diffuse disease in the proximal segment, mild to moderate calcification is also evident especially in the proximal segment with a 30% stenosis.  Mid segment has a long tubular 30% stenosis. RI: Large-caliber vessel.  Smooth and normal. CX: Moderate caliber vessel in proximal segment with mild disease in the proximal segment of 30%.  He was origin to a moderate-sized OM1 and OM 2.  Mild disease is evident in the mid circumflex. RCA: Very large caliber vessel, superdominant, diffuse luminal irregularity in  the proximal and mid segment with severe calcification of the proximal to mid to  distal segment of the right coronary artery.  There is a large calcific spicule in the mid segment of the RCA giving a 90% stenosis appearance in some views and a 50% appearance in the other views. Pressure wire performed, CFR was 0.95.  Not hemodynamically significant.      Impression:  Moderate coronary artery disease involving the LAD and circumflex, severe calcification of a very large superdominant right coronary artery with a complex mid RCA stenosis which appears to be 80 to 90% but not hemodynamically significant, suspect medial calcification.   Aggressive risk factor modification is indicated.   Aspirin  was discontinued, patient was started on Plavix .   Patient also has slow flow in all the coronary vessels suggestive of microvascular dysfunction   RADIOLOGY: CT ANGIO CHEST AORTA W/ & OR WO/CM & GATING ([place] ONLY) 06/05/2024 1. Stable dilated aortic root measuring up to approximately 4.6-4.7 cm at the level of the sinuses of Valsalva. The ascending thoracic aorta is normal in caliber with maximal diameter of approximately 3.4 cm. Recommend annual imaging followup by CTA or MRA. This recommendation follows 2010 ACCF/AHA/AATS/ACR/ASA/SCA/SCAI/SIR/STS/SVM Guidelines for the Diagnosis and Management of Patients with Thoracic Aortic Disease. Circulation. 2010; 121: Z733-z630. Aortic aneurysm NOS (ICD10-I71.9) 2. Coronary atherosclerosis with calcified coronary artery plaque. 3. Stable appearance of thyroid  nodules which have previously been characterized by ultrasound and sampled by fine needle aspiration.   Risk Assessment/Calculations:   NA   Labs:    External Labs: Collected: 06/26/2022 Hemoglobin 13.6, hematocrit 42.8%   Collected 05/24/2022  BUN 18, creatinine 1.1. eGFR 80. Sodium 136, potassium 4.2, chloride 98, AST 23, ALT 17, alkaline phosphatase 72. Total cholesterol 210, triglycerides 130, HDL 46, LDL 141. TSH 2.0   External Labs: Collected: 10/24/2022 provided by  the patient. BUN 15, creatinine 1.02. Sodium 138, potassium 4.2, chloride 96, bicarb 24. AST 24, ALT 19, alkaline phosphatase 76. Total cholesterol 195, triglycerides 159, HDL 54, LDL direct 116, non-HDL 113   External Labs: Collected:  11/07/2022  hemoglobin 13.3, hematocrit 39%  BUN 13, creatinine 1.04. Sodium 139, potassium 4.5, chloride 99, bicarb  26,  A1c 5.4   External Labs: Collected: 12/06/2022 Apolipoprotein A1 126 (within normal limits). Apolipoprotein B 76 (within normal limits) LP(a) 118.1 (normal values <75)   External Labs:  Collected 05/08/2023 provided by patient.  BUN 16, Cr 0.99 Na 136, K 4.3, Cl 94, CO2 24, AST 21, ALT 16  AlK Phos 62 Total cholesterol 160, TAG 100, HDL 50, direct LDL 93 Apolipo A-1 106 and B 87 (w/n normal limits) Lp(a) 102.    External Labs: Collected: 06/20/2023 provided by the patient. BUN 13, creatinine 0.98. Sodium 136, potassium 4.3, chloride 98, bicarb 25 AST 25, ALT 21, alkaline phosphatase 68. Total cholesterol 122, triglycerides 71, HDL 50, direct LDL 61, non-HDL 57 LP(a): 00.5  External Labs: Collected: January 30, 2024 [facility] database. BUN 15, creatinine 1.12. AST ALT and alkaline phosphatase within normal limits. Total cholesterol 87, triglycerides 167, HDL 50, LDL calculated 10, direct LDL 17  External Labs: Collected: 07/28/2024 [facility] database, provided by the patient. Potassium 4.2. BUN 12, creatinine 0.97. AST, ALT, alkaline phosphatase within normal limits. Triglycerides 98, HDL 48, LDL 10 LP(a) 67  Physical Exam:    Today's Vitals   07/31/24 0817  BP: 130/83  Pulse: (!) 57  SpO2: 96%  Weight: 200 lb 6.4 oz (90.9 kg)  Height: 6' (1.829 m)  Body mass index is 27.18 kg/m. Wt Readings from Last 3 Encounters:  07/31/24 200 lb 6.4 oz (90.9 kg)  02/03/24 203 lb 6.4 oz (92.3 kg)  07/08/23 199 lb (90.3 kg)    Physical Exam  Constitutional: No distress.  hemodynamically stable  Neck: No JVD present.   Cardiovascular: Regular rhythm, S1 normal and S2 normal. Bradycardia present. Exam reveals no gallop, no S3 and no S4.  No murmur heard. Pulmonary/Chest: Effort normal and breath sounds normal. No stridor. He has no wheezes. He has no rales.  Musculoskeletal:        General: No edema.     Cervical back: Neck supple.  Skin: Skin is warm.     Impression & Recommendation(s):  Impression: No diagnosis found.    Recommendation(s):  Coronary artery disease involving native coronary artery of native heart without angina pectoris Coronary atherosclerosis due to calcified coronary lesion Agatston CAC score, >400 Total CAC 3490, 99th percentile  Continue antiplatelet therapy and lipid-lowering agents. Lipids 07/28/2024 independently reviewed, LDL and triglycerides are at goal Decrease Lipitor to 20 mg p.o. nightly Refill Repatha  Has used sublingual nitroglycerin  tablet x 1 since last office visit. No active anginal chest pain or heart failure symptoms Echocardiogram preserved LVEF, grade 1 diastolic dysfunction, mild TR and MR Angiography: Nonobstructive disease in the LAD and LCx but obstructive disease in the RCA which is not hemodynamically significant based on invasive hemodynamics. Recommend aggressive management of his cardiovascular risk factors.  Pure hypercholesterolemia LDL 10 mg/dL LP(a) 67 Will decrease Lipitor to 20 mg p.o. nightly, medication refilled  Continue Repatha  140 mg every 14 days.  Refill provided. Outside labs independently reviewed, provided by the patient and mentioned above Monitor for now  Aortic root aneurysm Chronic and stable. Incidentally noted on coronary calcium  score. Recent echocardiogram from March 2025 noted root to be 46 mm. CT aorta gated study August 2025 noted aortic root to remain stable between 46-47 mm, 1 year follow-up CT recommended. Plan echocardiogram April 2026-to evaluate aortic root dimensions as well as severity of AI. No  significant AI murmur appreciated on physical examination Blood pressures are well-controlled. Reemphasized importance of blood pressure management. Patient is aware of avoiding prolonged isometric exercises/weight lifting Continue valsartan and Toprol-XL  Benign hypertension Office blood pressures are well-controlled. Continue hydrochlorothiazide 50 mg p.o. every morning. Continue valsartan 320 mg p.o. every afternoon.  Medication refilled Continue Toprol-XL 25 mg p.o. daily   Orders Placed:  No orders of the defined types were placed in this encounter.    Final Medication List:   No orders of the defined types were placed in this encounter.   There are no discontinued medications.   Current Outpatient Medications:    acetaminophen  (TYLENOL ) 500 MG tablet, Take 1,000 mg by mouth every 6 (six) hours as needed for moderate pain., Disp: , Rfl:    atorvastatin  (LIPITOR) 40 MG tablet, Take 1 tablet (40 mg total) by mouth at bedtime., Disp: 90 tablet, Rfl: 2   Cholecalciferol (VITAMIN D3) 50 MCG (2000 UT) CHEW, Chew 1 capsule by mouth daily., Disp: , Rfl:    clopidogrel  (PLAVIX ) 75 MG tablet, Take 1 tablet (75 mg total) by mouth daily., Disp: 90 tablet, Rfl: 3   Cyanocobalamin (HM SUPER VITAMIN B12) 2500 MCG CHEW, Chew 1 capsule by mouth daily., Disp: , Rfl:    Evolocumab  (REPATHA  SURECLICK) 140 MG/ML SOAJ, Inject 140 mg into the skin every 14 (fourteen) days., Disp: 6 mL, Rfl: 3   hydrochlorothiazide (HYDRODIURIL) 50  MG tablet, Take 50 mg by mouth daily., Disp: , Rfl:    Ketotifen Fumarate (ITCHY EYE DROPS OP), Place 1 drop into both eyes daily as needed (itchy eyes)., Disp: , Rfl:    metoprolol succinate (TOPROL-XL) 25 MG 24 hr tablet, Take 25 mg by mouth daily., Disp: , Rfl:    Multiple Vitamins-Minerals (CENTRUM SILVER 50+MEN PO), Take by mouth., Disp: , Rfl:    nitroGLYCERIN  (NITROSTAT ) 0.4 MG SL tablet, Place 1 tablet (0.4 mg total) under the tongue every 5 (five) minutes as needed  for chest pain. If you require more than two tablets five minutes apart go to the nearest ER via EMS., Disp: 30 tablet, Rfl: 0   omeprazole (PRILOSEC) 20 MG capsule, Take 20 mg by mouth daily., Disp: , Rfl:    valsartan (DIOVAN) 320 MG tablet, Take 320 mg by mouth at bedtime., Disp: , Rfl:   Consent:   NA  Disposition:   6 months follow-up sooner if needed  His questions and concerns were addressed to his satisfaction. He voices understanding of the recommendations provided during this encounter.    Signed, Madonna Michele HAS, [facility]  A Division of [facility] [address] [place], [place] [address]   07/31/2024 8:26 AM

## 2024-09-07 ENCOUNTER — Other Ambulatory Visit (HOSPITAL_BASED_OUTPATIENT_CLINIC_OR_DEPARTMENT_OTHER): Payer: Self-pay

## 2024-09-07 ENCOUNTER — Emergency Department (HOSPITAL_BASED_OUTPATIENT_CLINIC_OR_DEPARTMENT_OTHER)

## 2024-09-07 ENCOUNTER — Other Ambulatory Visit: Payer: Self-pay

## 2024-09-07 ENCOUNTER — Telehealth: Payer: Self-pay | Admitting: Cardiology

## 2024-09-07 ENCOUNTER — Emergency Department (HOSPITAL_BASED_OUTPATIENT_CLINIC_OR_DEPARTMENT_OTHER)
Admission: EM | Admit: 2024-09-07 | Discharge: 2024-09-07 | Disposition: A | Discharge status: Home / Self Care | Source: Ambulatory Visit | Attending: Emergency Medicine | Admitting: Emergency Medicine

## 2024-09-07 DIAGNOSIS — Z7902 Long term (current) use of antithrombotics/antiplatelets: Secondary | ICD-10-CM | POA: Diagnosis not present

## 2024-09-07 DIAGNOSIS — I4891 Unspecified atrial fibrillation: Secondary | ICD-10-CM | POA: Diagnosis not present

## 2024-09-07 DIAGNOSIS — I2510 Atherosclerotic heart disease of native coronary artery without angina pectoris: Secondary | ICD-10-CM | POA: Diagnosis not present

## 2024-09-07 DIAGNOSIS — R002 Palpitations: Secondary | ICD-10-CM | POA: Diagnosis present

## 2024-09-07 DIAGNOSIS — Z7901 Long term (current) use of anticoagulants: Secondary | ICD-10-CM | POA: Insufficient documentation

## 2024-09-07 DIAGNOSIS — Z79899 Other long term (current) drug therapy: Secondary | ICD-10-CM | POA: Insufficient documentation

## 2024-09-07 LAB — CBC
HCT: 43.9 % (ref 39.0–52.0)
Hemoglobin: 14 g/dL (ref 13.0–17.0)
MCH: 24.6 pg — ABNORMAL LOW (ref 26.0–34.0)
MCHC: 31.9 g/dL (ref 30.0–36.0)
MCV: 77.2 fL — ABNORMAL LOW (ref 80.0–100.0)
Platelets: 253 K/uL (ref 150–400)
RBC: 5.69 MIL/uL (ref 4.22–5.81)
RDW: 15.4 % (ref 11.5–15.5)
WBC: 4.9 K/uL (ref 4.0–10.5)
nRBC: 0 % (ref 0.0–0.2)

## 2024-09-07 LAB — BASIC METABOLIC PANEL WITH GFR
Anion gap: 8 (ref 5–15)
BUN: 14 mg/dL (ref 6–20)
CO2: 31 mmol/L (ref 22–32)
Calcium: 9.8 mg/dL (ref 8.9–10.3)
Chloride: 97 mmol/L — ABNORMAL LOW (ref 98–111)
Creatinine, Ser: 0.96 mg/dL (ref 0.61–1.24)
GFR, Estimated: >60 mL/min (ref >60)
Glucose, Bld: 108 mg/dL — ABNORMAL HIGH (ref 70–99)
Potassium: 3.8 mmol/L (ref 3.5–5.1)
Sodium: 136 mmol/L (ref 135–145)

## 2024-09-07 LAB — TROPONIN T, HIGH SENSITIVITY: Troponin T High Sensitivity: <15 ng/L (ref 0–19)

## 2024-09-07 LAB — MAGNESIUM: Magnesium: 2.1 mg/dL (ref 1.7–2.4)

## 2024-09-07 MED ORDER — DILTIAZEM LOAD VIA INFUSION
15.0000 mg | Freq: Once | INTRAVENOUS | Status: AC
  Administered 2024-09-07: 15 mg via INTRAVENOUS
  Filled 2024-09-07: qty 15

## 2024-09-07 MED ORDER — SODIUM CHLORIDE 0.9 % IV BOLUS
1000.0000 mL | Freq: Once | INTRAVENOUS | Status: AC
  Administered 2024-09-07: 1000 mL via INTRAVENOUS

## 2024-09-07 MED ORDER — APIXABAN 5 MG PO TABS
5.0000 mg | ORAL_TABLET | Freq: Two times a day (BID) | ORAL | 0 refills | Status: DC
  Filled 2024-09-07 (×2): qty 60, 30d supply, fill #0

## 2024-09-07 MED ORDER — DILTIAZEM HCL ER COATED BEADS 120 MG PO CP24
120.0000 mg | ORAL_CAPSULE | Freq: Every day | ORAL | 0 refills | Status: AC
  Filled 2024-09-07 (×2): qty 30, 30d supply, fill #0

## 2024-09-07 MED ORDER — DILTIAZEM HCL-DEXTROSE 125-5 MG/125ML-% IV SOLN (PREMIX)
5.0000 mg/h | INTRAVENOUS | Status: DC
  Administered 2024-09-07: 5 mg/h via INTRAVENOUS
  Filled 2024-09-07: qty 125

## 2024-09-07 MED ORDER — APIXABAN 2.5 MG PO TABS
5.0000 mg | ORAL_TABLET | Freq: Once | ORAL | Status: AC
  Administered 2024-09-07: 5 mg via ORAL
  Filled 2024-09-07: qty 2

## 2024-09-07 MED ORDER — DILTIAZEM HCL ER COATED BEADS 120 MG PO CP24
120.0000 mg | ORAL_CAPSULE | Freq: Once | ORAL | Status: AC
  Administered 2024-09-07: 120 mg via ORAL
  Filled 2024-09-07: qty 1

## 2024-09-07 NOTE — ED Triage Notes (Signed)
 Reports palpations starting around 0500 this morning. Hx of afib. Took metoprolol pta with no relief. Denies [facility]

## 2024-09-07 NOTE — ED Provider Notes (Signed)
 [place] EMERGENCY DEPARTMENT AT [facility] Provider Note   CSN: [id number] Arrival date & time: 09/07/24  9266     Patient presents with: Palpitations   John Odonnell is a 58 y.o. male.   Patient here with palpitations.  Somewhat felt bit more this morning than in previous days.  Is not anticoagulated he is on Plavix .  His Apple watch showed him that he was in A-fib.  He has a history of A-fib in the distant past but he has not had any issues with that in the past.  He has history of high cholesterol aneurysm CAD.  Denies any chest pain shortness of breath weakness numbness tingling.  Denies any leg swelling.  He took his morning metoprolol.  The history is provided by the patient.       Prior to Admission medications   Medication Sig Start Date End Date Taking? Authorizing Provider  apixaban (ELIQUIS) 5 MG TABS tablet Take 1 tablet (5 mg total) by mouth 2 (two) times daily. 09/07/24 10/07/24 Yes Hertha Gergen, DO  diltiazem  (CARDIZEM  CD) 120 MG 24 hr capsule Take 1 capsule (120 mg total) by mouth daily. 09/07/24 10/07/24 Yes Kalana Yust, DO  acetaminophen  (TYLENOL ) 500 MG tablet Take 1,000 mg by mouth every 6 (six) hours as needed for moderate pain.    [provider]  atorvastatin  (LIPITOR) 20 MG tablet Take 1 tablet (20 mg total) by mouth at bedtime. 07/31/24   Tolia, Sunit, DO  Cholecalciferol (VITAMIN D3) 50 MCG (2000 UT) CHEW Chew 1 capsule by mouth daily.    [provider]  Cyanocobalamin (HM SUPER VITAMIN B12) 2500 MCG CHEW Chew 1 capsule by mouth daily.    [provider]  Evolocumab  (REPATHA  SURECLICK) 140 MG/ML SOAJ Inject 140 mg into the skin every 14 (fourteen) days. 07/31/24   Tolia, Sunit, DO  hydrochlorothiazide (HYDRODIURIL) 50 MG tablet Take 50 mg by mouth daily. 10/27/22   [provider]  Ketotifen Fumarate (ITCHY EYE DROPS OP) Place 1 drop into both eyes daily as needed (itchy eyes).    [provider]   metoprolol succinate (TOPROL-XL) 25 MG 24 hr tablet Take 25 mg by mouth daily. 01/19/22   [provider]  Multiple Vitamins-Minerals (CENTRUM SILVER 50+MEN PO) Take by mouth.    [provider]  nitroGLYCERIN  (NITROSTAT ) 0.4 MG SL tablet Place 1 tablet (0.4 mg total) under the tongue every 5 (five) minutes as needed for chest pain. If you require more than two tablets five minutes apart go to the nearest ER via EMS. 11/06/22 07/31/24  Tolia, Sunit, DO  omeprazole (PRILOSEC) 20 MG capsule Take 20 mg by mouth daily.    [provider]  valsartan (DIOVAN) 320 MG tablet Take 1 tablet (320 mg total) by mouth at bedtime. 07/31/24   Tolia, Sunit, DO    Allergies: Patient has no known allergies.    Review of Systems  Updated Vital Signs BP 127/84   Pulse (!) 52   Temp (!) 97.5 F (36.4 C) (Oral)   Resp 11   Ht 6' (1.829 m)   Wt 88.5 kg   SpO2 98%   BMI 26.45 kg/m   Physical Exam Vitals and nursing note reviewed.  Constitutional:      General: He is not in acute distress.    Appearance: He is well-developed. He is not ill-appearing.  HENT:     Head: Normocephalic and atraumatic.  Eyes:     Extraocular Movements: Extraocular movements intact.  Conjunctiva/sclera: Conjunctivae normal.     Pupils: Pupils are equal, round, and reactive to light.  Cardiovascular:     Rate and Rhythm: Tachycardia present. Rhythm irregular.     Pulses: Normal pulses.     Heart sounds: Normal heart sounds. No murmur heard. Pulmonary:     Effort: Pulmonary effort is normal. No respiratory distress.     Breath sounds: Normal breath sounds.  Abdominal:     Palpations: Abdomen is soft.     Tenderness: There is no abdominal tenderness.  Musculoskeletal:        General: No swelling.     Cervical back: Normal range of motion and neck supple.  Skin:    General: Skin is warm and dry.     Capillary Refill: Capillary refill takes less than 2 seconds.  Neurological:     General:  No focal deficit present.     Mental Status: He is alert and oriented to person, place, and time.     Cranial Nerves: No cranial nerve deficit.     Sensory: No sensory deficit.     Motor: No weakness.     Coordination: Coordination normal.  Psychiatric:        Mood and Affect: Mood normal.     (all labs ordered are listed, but only abnormal results are displayed) Labs Reviewed  BASIC METABOLIC PANEL WITH GFR - Abnormal; Notable for the following components:      Result Value   Chloride 97 (*)    Glucose, Bld 108 (*)    All other components within normal limits  CBC - Abnormal; Notable for the following components:   MCV 77.2 (*)    MCH 24.6 (*)    All other components within normal limits  MAGNESIUM  TROPONIN T, HIGH SENSITIVITY    EKG: EKG Interpretation Date/Time:  Monday September 07 2024 10:40:30 EST Ventricular Rate:  58 PR Interval:  153 QRS Duration:  101 QT Interval:  424 QTC Calculation: 417 R Axis:   66  Text Interpretation: Sinus rhythm Confirmed by Ruthe Cornet [phone]) on 09/07/2024 10:51:42 AM  Radiology: [facility] Chest Port 1 View Result Date: 09/07/2024 EXAM: 1 VIEW(S) XRAY OF THE CHEST 09/07/2024 08:19:00 AM COMPARISON: 03/08/2015 CLINICAL HISTORY: Chest pain. FINDINGS: LUNGS AND PLEURA: Increased pulmonary vascularity without overt edema. No focal pulmonary opacity. No pleural effusion. No pneumothorax. HEART AND MEDIASTINUM: No acute abnormality of the cardiac and mediastinal silhouettes. BONES AND SOFT TISSUES: Age indeterminate left posterior 8th rib fracture. IMPRESSION: 1. Increased pulmonary vascularity without overt edema or pleural effusion. Electronically signed by: Waddell Calk MD 09/07/2024 08:21 AM EST RP Workstation: [id number]     .Critical Care  Performed by: Ruthe Cornet, DO Authorized by: Ruthe Cornet, DO   Critical care provider statement:    Critical care time (minutes):  35   Critical care was necessary to treat or prevent  imminent or life-threatening deterioration of the following conditions: atrial fibrillation with RVR.   Critical care was time spent personally by me on the following activities:  Blood draw for specimens, development of treatment plan with patient or surrogate, discussions with consultants, evaluation of patient's response to treatment, examination of patient, obtaining history from patient or surrogate, ordering and performing treatments and interventions, ordering and review of laboratory studies, ordering and review of radiographic studies, pulse oximetry, re-evaluation of patient's condition and review of old charts   Care discussed with: admitting provider      Medications Ordered in the ED  diltiazem  (CARDIZEM )  1 mg/mL load via infusion 15 mg (15 mg Intravenous Bolus from Bag 09/07/24 0753)  sodium chloride  0.9 % bolus 1,000 mL (0 mLs Intravenous Stopped 09/07/24 0910)  apixaban (ELIQUIS) tablet 5 mg (5 mg Oral Given 09/07/24 0858)  diltiazem  (CARDIZEM  CD) 24 hr capsule 120 mg (120 mg Oral Given 09/07/24 0912)            CHA2DS2-VASc Score: 2                        Medical Decision Making Amount and/or Complexity of Data Reviewed Labs: ordered. Radiology: ordered.  Risk Prescription drug management.   Kenden Brandt is here with palpitations.  EKG shows atrial fibrillation with RVR per my review and interpretation.  Seems like it possibly started overnight but not very symptomatic with the lower heart rate here but he felt that maybe when he was tired this morning.  He is not anticoagulated.  Will start him on IV diltiazem  bolus and infusion and see if we can get a more rate controlled.  He took his morning metoprolol already.  He is on Plavix  for CAD history.  He had A-fib in the past but only 1 time does not sound like he has been anticoagulated for it in the past.  Sounds like he might had a cardioversion.  Ultimately will get basic labs reevaluate give IV fluids see if there is any  other process causing this.  But he denies any infectious symptoms.  Denies feeling ill recently.  No leg swelling on exam.  Per my review interpretation of labs and images is no significant leukocytosis anemia or electrolyte abnormality or kidney injury.  No signs of volume overload on x-ray.  Troponins normal.  Electrolytes unremarkable.  I talked with Dr. Pietro with cardiology.  On IV diltiazem  patient's rate controlled now to the 80 bpm 100 bpm.  Ultimately talked with him on the phone he reviewed his cardiology stuff and we will discontinue Plavix  and start Eliquis.  Will add Cardizem  orally 120 daily extended release.  He will continue on his metoprolol.  At this time we will give him a dose of p.o. diltiazem  and will discontinue IV infusion and see if we can keep him rate controlled just on oral medications at this time.  I will refer him to A-fib clinic.  His Chad Vascor is 2.  Patient does wear an Scientist, Physiological.  He is asymptomatic at this time.  I anticipate hopefully we can get him discharged with these medication adjustments and he can call cardiology in the phone if he is having any persistent elevation of his heart rate or can return for further evaluation as well.  Overall after patient was given oral metoprolol and diltiazem  infusion was felt off he did convert back to a normal sinus rhythm with a heart rate in the low 50s.  This seems to be his resting heart rate.  Ultimately blood pressure was still normal in the 130s.  Overall he will continue his metoprolol start Eliquis.  Given that he is fairly bradycardic we will have him hold on diltiazem  until tomorrow afternoon/use as a as needed for now and follow-up with A-fib clinic for further discussion.  He understands return precautions.  This chart was dictated using voice recognition software.  Despite best efforts to proofread,  errors can occur which can change the documentation meaning.      Final diagnoses:  Atrial fibrillation with  RVR (HCC)  ED Discharge Orders          Ordered    Amb referral to AFIB Clinic        09/07/24 0849    apixaban (ELIQUIS) 5 MG TABS tablet  2 times daily        09/07/24 0913    diltiazem  (CARDIZEM  CD) 120 MG 24 hr capsule  Daily        09/07/24 0913               Ruthe Cornet, DO 09/07/24 1052

## 2024-09-07 NOTE — Discharge Instructions (Addendum)
 Discontinue your Plavix .  You are going to start Eliquis for anticoagulation for A-fib.  Take your next dose of Eliquis this evening.  You have also been started on diltiazem /Cardizem  by mouth for your A-fib.  Take this as needed for atrial fibrillation with rapid rate as we discussed.   Please return if heart rates persistently above 150/symptomatic from your A-fib.  You can talk with your cardiology on the phone about maybe titrating some your oral medications prior to coming back as well.  I have referred you to atrial fibrillation clinic.

## 2024-09-07 NOTE — Telephone Encounter (Signed)
 Patient reports he woke up this morning and noted an elevated HR and his apple watch read as atrial fibrillation. Says he called in earlier this morning and spoke with on-call MD, advised to take Toprol XL 25mg  dose. Remains have remained elevated in the 120s, BP is stable. He reports a remote isolated incidence of afib back in 2016 but none since that time and not on anticoagulation. I advised giving on-going symptoms despite attempted BB use, would be seen in the ED for further evaluation with EKG and initiation of medications as appropriate. He voiced understanding and thanked me for callback.

## 2024-09-07 NOTE — ED Notes (Signed)
 D/C'ed cardizem  drip.

## 2024-09-08 ENCOUNTER — Other Ambulatory Visit (HOSPITAL_COMMUNITY): Payer: Self-pay

## 2024-09-09 ENCOUNTER — Other Ambulatory Visit (HOSPITAL_COMMUNITY): Payer: Self-pay

## 2024-09-11 ENCOUNTER — Ambulatory Visit (HOSPITAL_COMMUNITY)
Admission: RE | Admit: 2024-09-11 | Discharge: 2024-09-11 | Disposition: A | Discharge status: Home / Self Care | Source: Ambulatory Visit | Attending: Internal Medicine | Admitting: Internal Medicine

## 2024-09-11 ENCOUNTER — Encounter (HOSPITAL_COMMUNITY): Payer: Self-pay | Admitting: Internal Medicine

## 2024-09-11 VITALS — BP 128/90 | HR 57 | Ht 72.0 in | Wt 197.2 lb

## 2024-09-11 DIAGNOSIS — D6869 Other thrombophilia: Secondary | ICD-10-CM

## 2024-09-11 DIAGNOSIS — I480 Paroxysmal atrial fibrillation: Secondary | ICD-10-CM | POA: Diagnosis not present

## 2024-09-11 DIAGNOSIS — I4891 Unspecified atrial fibrillation: Secondary | ICD-10-CM

## 2024-09-11 MED ORDER — APIXABAN 5 MG PO TABS: 5.0000 mg | ORAL_TABLET | Freq: Two times a day (BID) | ORAL | 6 refills | Status: DC

## 2024-09-11 NOTE — Patient Instructions (Signed)
 You may go to any [facility] Location for your lab work:one month 10/11/24  [facility] - [address] ([facility]) - [address] [phone] [address]. [address]  -[address]  If you have any lab test that is abnormal or we need to change your treatment, we will call you   [facility] -- scheduling will call once insurance authorization received.

## 2024-09-11 NOTE — Progress Notes (Addendum)
 Primary Care Physician: Remonia Alm PARAS, MD Primary Cardiologist: Madonna Large, DO Electrophysiologist: None     Referring Physician: ED     John Odonnell is a 58 y.o. male with a history of A-fib (1 episode in 2016), hyperlipidemia, CAD, hypertension, and aneurysmal dilatation of the aortic root who presents for consultation in the [facility].  Seen in ED on 09/07/2024 for A-fib with RVR.  Plavix  stopped.  Patient prescribed diltiazem  120 mg daily.  He takes historically Toprol 25 mg daily.  Patient is on Eliquis  for stroke prevention.  On evaluation today, patient is currently in NSR.  Patient never began the diltiazem  due to bradycardia on metoprolol.  He has not noted any additional A-fib episodes since ED visit.  Patient notes to not having slept well the week prior to ED visit.  He wears an Apple watch series 10.  He snores per wife.  He felt the palpitations during the episode in question and his watch confirmed A-fib.  Today, he denies symptoms of chest pain, shortness of breath, orthopnea, PND, lower extremity edema, dizziness, presyncope, syncope, bleeding, or neurologic sequela. The patient is tolerating medications without difficulties and is otherwise without complaint today.    Atrial Fibrillation Risk Factors:  he does have symptoms or diagnosis of sleep apnea.   he has a BMI of Body mass index is 26.75 kg/m.SABRA Filed Weights   09/11/24 1036  Weight: 89.4 kg    Current Outpatient Medications  Medication Sig Dispense Refill   acetaminophen  (TYLENOL ) 500 MG tablet Take 1,000 mg by mouth every 6 (six) hours as needed for moderate pain.     apixaban  (ELIQUIS ) 5 MG TABS tablet Take 1 tablet (5 mg total) by mouth 2 (two) times daily. 60 tablet 0   atorvastatin  (LIPITOR) 20 MG tablet Take 1 tablet (20 mg total) by mouth at bedtime. 90 tablet 3   Cholecalciferol (VITAMIN D3) 50 MCG (2000 UT) CHEW Chew 1 capsule by mouth daily.     Cyanocobalamin  (HM SUPER VITAMIN B12) 2500 MCG CHEW Chew 1 capsule by mouth daily.     Evolocumab  (REPATHA  SURECLICK) 140 MG/ML SOAJ Inject 140 mg into the skin every 14 (fourteen) days. 6 mL 3   hydrochlorothiazide (HYDRODIURIL) 50 MG tablet Take 50 mg by mouth daily.     Ketotifen Fumarate (ITCHY EYE DROPS OP) Place 1 drop into both eyes daily as needed (itchy eyes).     metoprolol succinate (TOPROL-XL) 25 MG 24 hr tablet Take 25 mg by mouth daily.     Multiple Vitamins-Minerals (CENTRUM SILVER 50+MEN PO) Take by mouth.     nitroGLYCERIN  (NITROSTAT ) 0.4 MG SL tablet Place 1 tablet (0.4 mg total) under the tongue every 5 (five) minutes as needed for chest pain. If you require more than two tablets five minutes apart go to the nearest ER via EMS. 30 tablet 0   omeprazole (PRILOSEC) 20 MG capsule Take 20 mg by mouth daily.     valsartan  (DIOVAN ) 320 MG tablet Take 1 tablet (320 mg total) by mouth at bedtime. 90 tablet 3   diltiazem  (CARDIZEM  CD) 120 MG 24 hr capsule Take 1 capsule (120 mg total) by mouth daily. (Patient not taking: Reported on 09/11/2024) 30 capsule 0   No current facility-administered medications for this encounter.    Atrial Fibrillation Management history:  Previous antiarrhythmic drugs: None Previous cardioversions: None Previous ablations: None Anticoagulation history: Eliquis    ROS- All systems are reviewed and negative  except as per the HPI above.  Physical Exam: BP (!) 128/90   Pulse (!) 57   Ht 6' (1.829 m)   Wt 89.4 kg   BMI 26.75 kg/m   GEN: Well nourished, well developed in no acute distress NECK: No JVD; No carotid bruits CARDIAC: Regular rate and rhythm, no murmurs, rubs, gallops RESPIRATORY:  Clear to auscultation without rales, wheezing or rhonchi  ABDOMEN: Soft, non-tender, non-distended EXTREMITIES:  No edema; No deformity   EKG today demonstrates  EKG Interpretation Date/Time:  Friday September 11 2024 10:38:50 EST Ventricular Rate:  57 PR  Interval:  148 QRS Duration:  86 QT Interval:  440 QTC Calculation: 428 R Axis:   72  Text Interpretation: Sinus bradycardia Otherwise normal ECG When compared with ECG of 07-Sep-2024 10:40, PREVIOUS ECG IS PRESENT Confirmed by Terra Pac ([id number]) on 09/11/2024 10:40:18 AM    Echo 12/27/2023 demonstrated  1. Left ventricular ejection fraction, by estimation, is 55 to 60%. The  left ventricle has normal function. The left ventricle has no regional  wall motion abnormalities. Left ventricular diastolic parameters are  consistent with Grade II diastolic  dysfunction (pseudonormalization). The average left ventricular global  longitudinal strain is -23.3 %. The global longitudinal strain is normal.   2. Right ventricular systolic function is normal. The right ventricular  size is normal. The estimated right ventricular systolic pressure is 21.3  mmHg.   3. Left atrial size was mildly dilated.   4. The mitral valve is normal in structure. Trivial mitral valve  regurgitation. No evidence of mitral stenosis.   5. The aortic valve is normal in structure. Aortic valve regurgitation is  mild. Aortic valve sclerosis/calcification is present, without any  evidence of aortic stenosis. Aortic regurgitation PHT measures 549 msec.   6. Aortic dilatation noted. Aneurysm of the aortic root, measuring 46 mm.   7. The inferior vena cava is normal in size with greater than 50%  respiratory variability, suggesting right atrial pressure of 3 mmHg.   8. Cannot exclude a small PFO (suggustive in the PSAX view). Recommend  agitated saline contrast study.   ASSESSMENT & PLAN CHA2DS2-VASc Score = 2  The patient's score is based upon: CHF History: 0 HTN History: 1 Diabetes History: 0 Stroke History: 0 Vascular Disease History: 1 Age Score: 0 Gender Score: 0       ASSESSMENT AND PLAN: Paroxysmal Atrial Fibrillation (ICD10:  I48.0) The patient's CHA2DS2-VASc score is 2, indicating a 2.2% annual risk of  stroke.    Patient is currently in NSR. Education provided about Afib. Discussion about triggers for A-fib and medication treatments and ablation going forward if indicated. After discussion, we will proceed with conservative observation at this time.  He will use his watch to monitor rhythm at home.  Will refer patient for sleep study.  Continue Toprol 25 mg daily.   Secondary Hypercoagulable State (ICD10:  D68.69) The patient is at significant risk for stroke/thromboembolism based upon his CHA2DS2-VASc Score of 2.  Continue Apixaban  (Eliquis ).  We discussed the reasoning behind anticoagulation in the setting of stroke prevention related to Afib. We discussed the benefits vs risks of anticoagulation. After discussion, patient would like to continue anticoagulation and understands the potential risks. Will print order for patient to have CBC in 1 month.     Follow up 6 months A-fib clinic.  I spent a total of 30 minutes with this patient today face to face. We discussed triggers for Afib and multiple treatment options for  atrial fibrillation, including rate control, rhythm control, and ablation. I explained the possible risks vs benefits of medication and procedure, and different possible methods of long term treatment. I answered multiple questions from patient and spouse during this time and reviewed his ECG and history during the visit.      Terra Pac, [facility]  Afib Clinic [address] [place], [place] [address] [phone]

## 2024-09-17 ENCOUNTER — Telehealth: Payer: Self-pay | Admitting: Physician Assistant

## 2024-09-17 NOTE — Telephone Encounter (Signed)
 Patient was in atrial fibrillation 11/17.  He paged after our answering service regarding episodes of dizziness this morning.  Apple watch still shows he is in sinus rhythm.  Resting heart rate between high 40s to low 50s.  He has not started on diltiazem .  I asked him to hold off on taking diltiazem  given borderline slow heart rate.  He is on metoprolol succinate which he should continue.  I asked him to keep adequate hydration.  At this time, there is no evidence that he has recurrent A-fib this morning.  Will continue observation

## 2024-09-22 ENCOUNTER — Encounter: Payer: Self-pay | Admitting: Cardiology

## 2024-09-23 NOTE — Progress Notes (Deleted)
 Cardiology Clinic Note   Patient Name: John Odonnell Date of Encounter: 09/23/2024  Primary Care Provider:  Remonia Alm PARAS, MD Primary Cardiologist:  Madonna Large, DO  Patient Profile    John Odonnell 58 year old male presents to the clinic today for evaluation of his atrial fibrillation.  Past Medical History    Past Medical History:  Diagnosis Date   Agatston coronary artery calcium  score greater than 400    Atrial fibrillation (HCC) 2016   TEE put back into rythmn   GERD (gastroesophageal reflux disease)    Hyperlipidemia    Hypertension    Past Surgical History:  Procedure Laterality Date   ANTERIOR CRUCIATE LIGAMENT REPAIR  10/22/1993   Left knee   COLONOSCOPY     CORONARY ANGIOGRAPHY N/A 11/16/2022   Procedure: CORONARY ANGIOGRAPHY;  Surgeon: Ladona Heinz, MD;  Location: [facility] INVASIVE CV LAB;  Service: Cardiovascular;  Laterality: N/A;   CORONARY PRESSURE/FFR STUDY N/A 11/16/2022   Procedure: INTRAVASCULAR PRESSURE WIRE/FFR STUDY;  Surgeon: Ladona Heinz, MD;  Location: [facility] INVASIVE CV LAB;  Service: Cardiovascular;  Laterality: N/A;   HERNIA REPAIR     POLYPECTOMY     TONSILLECTOMY AND ADENOIDECTOMY      Allergies  No Known Allergies  History of Present Illness    John Odonnell has a PMH of coronary artery disease, hyperlipidemia, atrial fibrillation, and elevated blood pressure readings.  He had 1 episode of atrial fibrillation in 2016.  His PMH also includes history of aortic root dilation.  CHA2DS2-VASc score 2 (HTN, vascular disease  He was seen in the emergency department on 09/07/2024.  He was noted to have atrial fibrillation with RVR.  His Plavix  was discontinued.  He was prescribed diltiazem  120 mg daily.  He was taking metoprolol 25 mg daily.  He was placed on Eliquis .  He was seen in follow-up in the atrial fibrillation clinic on 09/11/2024.  During that time he was in sinus rhythm.  He had not started his diltiazem  due to low heart rate with his metoprolol.  He  had not noted any additional episodes of atrial fibrillation since being in the emergency department.  He was monitoring his heart rate with his Apple Watch.  At that time he denied chest pain, shortness of breath, orthopnea, PND, lower extremity swelling, dizziness, presyncope, and bleeding issues.  He was referred for sleep study.  49-month follow-up was planned.  He contacted the clinic on 09/22/2024.  He reported experiencing blood pressures in the 118-147 systolic range and 70-94 diastolic range.  He also noted brief episodes of lightheaded/strange feeling during the afternoons.  He was added to my schedule for evaluation.  He presents to the clinic and states***.  *** denies chest pain, shortness of breath, lower extremity edema, fatigue, palpitations, melena, hematuria, hemoptysis, diaphoresis, weakness, presyncope, syncope, orthopnea, and PND.  Essential hypertension-BP today***. Heart healthy low-sodium diet Maintain p.o. hydration Continue metoprolol, valsartan   Atrial fibrillation-EKG today shows***.  Denies irregular or accelerated heart rate.  Continues to monitor his heart rate with his Apple Watch. Continue metoprolol, Eliquis  Avoid triggers caffeine, chocolate,  EtOH, dehydration excetra. Following with A-fib clinic  Lightheadedness/dizziness-continues to note brief intermittent episodes mainly in the afternoon.  Episodes last for***. Appears to be multifactorial related to hydration, vestibular system versus beta-blocker therapy. Increase p.o. hydration Epley maneuvers Change positions slowly  Hyperlipidemia-LDL***. High-fiber diet Continue atorvastatin   Disposition: Follow-up with Dr. Large or me in 2-3 months.  Home Medications    Prior to Admission medications  Medication Sig Start Date End Date Taking? Authorizing Provider  acetaminophen  (TYLENOL ) 500 MG tablet Take 1,000 mg by mouth every 6 (six) hours as needed for moderate pain.    [provider]   apixaban  (ELIQUIS ) 5 MG TABS tablet Take 1 tablet (5 mg total) by mouth 2 (two) times daily. 09/11/24   Terra Fairy PARAS, PA-C  atorvastatin  (LIPITOR) 20 MG tablet Take 1 tablet (20 mg total) by mouth at bedtime. 07/31/24   Tolia, Sunit, DO  Cholecalciferol (VITAMIN D3) 50 MCG (2000 UT) CHEW Chew 1 capsule by mouth daily.    [provider]  Cyanocobalamin (HM SUPER VITAMIN B12) 2500 MCG CHEW Chew 1 capsule by mouth daily.    [provider]  diltiazem  (CARDIZEM  CD) 120 MG 24 hr capsule Take 1 capsule (120 mg total) by mouth daily. Patient not taking: Reported on 09/11/2024 09/07/24 10/07/24  Ruthe Cornet, DO  Evolocumab  (REPATHA  SURECLICK) 140 MG/ML SOAJ Inject 140 mg into the skin every 14 (fourteen) days. 07/31/24   Tolia, Sunit, DO  hydrochlorothiazide (HYDRODIURIL) 50 MG tablet Take 50 mg by mouth daily. 10/27/22   [provider]  Ketotifen Fumarate (ITCHY EYE DROPS OP) Place 1 drop into both eyes daily as needed (itchy eyes).    [provider]  metoprolol succinate (TOPROL-XL) 25 MG 24 hr tablet Take 25 mg by mouth daily. 01/19/22   [provider]  Multiple Vitamins-Minerals (CENTRUM SILVER 50+MEN PO) Take by mouth.    [provider]  nitroGLYCERIN  (NITROSTAT ) 0.4 MG SL tablet Place 1 tablet (0.4 mg total) under the tongue every 5 (five) minutes as needed for chest pain. If you require more than two tablets five minutes apart go to the nearest ER via EMS. 11/06/22 09/11/24  Tolia, Sunit, DO  omeprazole (PRILOSEC) 20 MG capsule Take 20 mg by mouth daily.    [provider]  valsartan  (DIOVAN ) 320 MG tablet Take 1 tablet (320 mg total) by mouth at bedtime. 07/31/24   Michele Richardson, DO    Family History    Family History  Problem Relation Age of Onset   Bipolar disorder Mother    Depression Mother    Hypertension Mother    Schizophrenia Mother    Skin cancer Father 76       anal rectal melanoma   Depression Sister     Bipolar disorder Sister    Bipolar disorder Brother    Depression Brother    Hypertension Maternal Aunt    Heart disease Maternal Uncle    Hypertension Maternal Uncle    Heart attack Maternal Uncle    Hypertension Maternal Grandmother    Mental illness Maternal Grandmother    Hypertension Maternal Grandfather    Heart attack Maternal Grandfather    Heart disease Paternal Grandfather    Colon polyps Neg Hx    Colon cancer Neg Hx    Esophageal cancer Neg Hx    Stomach cancer Neg Hx    Crohn's disease Neg Hx    Rectal cancer Neg Hx    He indicated that his mother is alive. He indicated that his father is deceased. He indicated that his sister is alive. He indicated that his brother is alive. He indicated that his maternal grandmother is deceased. He indicated that his maternal grandfather is deceased. He indicated that his paternal grandmother is deceased. He indicated that his paternal grandfather is deceased. He indicated that the status of his maternal aunt is unknown. He indicated that his maternal  uncle is alive. He indicated that the status of his neg hx is unknown.  Social History    Social History   Socioeconomic History   Marital status: Married    Spouse name: Kate   Number of children: 2   Years of education: Not on file   Highest education level: Not on file  Occupational History   Not on file  Tobacco Use   Smoking status: Never    Passive exposure: Never   Smokeless tobacco: Never   Tobacco comments:    Never smoked 09/11/24  Vaping Use   Vaping status: Never Used  Substance and Sexual Activity   Alcohol use: Yes    Alcohol/week: 2.0 standard drinks of alcohol    Types: 2 Glasses of wine per week    Comment: socially   Drug use: No   Sexual activity: Yes  Other Topics Concern   Not on file  Social History Narrative   Not on file   Social Drivers of Health   Financial Resource Strain: Not on file  Food Insecurity: Not on file  Transportation Needs:  Not on file  Physical Activity: Not on file  Stress: Not on file  Social Connections: Not on file  Intimate Partner Violence: Not on file     Review of Systems    General:  No chills, fever, night sweats or weight changes.  Cardiovascular:  No chest pain, dyspnea on exertion, edema, orthopnea, palpitations, paroxysmal nocturnal dyspnea. Dermatological: No rash, lesions/masses Respiratory: No cough, dyspnea Urologic: No hematuria, dysuria Abdominal:   No nausea, vomiting, diarrhea, bright red blood per rectum, melena, or hematemesis Neurologic:  No visual changes, wkns, changes in mental status. All other systems reviewed and are otherwise negative except as noted above.  Physical Exam    VS:  There were no vitals taken for this visit. , BMI There is no height or weight on file to calculate BMI. GEN: Well nourished, well developed, in no acute distress. HEENT: normal. Neck: Supple, no JVD, carotid bruits, or masses. Cardiac: RRR, no murmurs, rubs, or gallops. No clubbing, cyanosis, edema.  Radials/DP/PT 2+ and equal bilaterally.  Respiratory:  Respirations regular and unlabored, clear to auscultation bilaterally. GI: Soft, nontender, nondistended, BS + x 4. MS: no deformity or atrophy. Skin: warm and dry, no rash. Neuro:  Strength and sensation are intact. Psych: Normal affect.  Accessory Clinical Findings    Recent Labs: 09/07/2024: BUN 14; Creatinine, Ser 0.96; Hemoglobin 14.0; Magnesium 2.1; Platelets 253; Potassium 3.8; Sodium 136   Recent Lipid Panel    Component Value Date/Time   CHOL 196 07/24/2013 1145   TRIG 137 07/24/2013 1145   HDL 47 07/24/2013 1145   CHOLHDL 4.2 07/24/2013 1145   VLDL 27 07/24/2013 1145   LDLCALC 122 (H) 07/24/2013 1145    No BP recorded.  {Refresh Note OR Click here to enter BP  :1}***    ECG personally reviewed by me today- ***     Echocardiogram 12/27/2023  IMPRESSIONS     1. Left ventricular ejection fraction, by estimation, is  55 to 60%. The  left ventricle has normal function. The left ventricle has no regional  wall motion abnormalities. Left ventricular diastolic parameters are  consistent with Grade II diastolic  dysfunction (pseudonormalization). The average left ventricular global  longitudinal strain is -23.3 %. The global longitudinal strain is normal.   2. Right ventricular systolic function is normal. The right ventricular  size is normal. The estimated right ventricular systolic  pressure is 21.3  mmHg.   3. Left atrial size was mildly dilated.   4. The mitral valve is normal in structure. Trivial mitral valve  regurgitation. No evidence of mitral stenosis.   5. The aortic valve is normal in structure. Aortic valve regurgitation is  mild. Aortic valve sclerosis/calcification is present, without any  evidence of aortic stenosis. Aortic regurgitation PHT measures 549 msec.   6. Aortic dilatation noted. Aneurysm of the aortic root, measuring 46 mm.   7. The inferior vena cava is normal in size with greater than 50%  respiratory variability, suggesting right atrial pressure of 3 mmHg.   8. Cannot exclude a small PFO (suggustive in the PSAX view). Recommend  agitated saline contrast study.   FINDINGS   Left Ventricle: Left ventricular ejection fraction, by estimation, is 55  to 60%. The left ventricle has normal function. The left ventricle has no  regional wall motion abnormalities. The average left ventricular global  longitudinal strain is -23.3 %.  Strain was performed and the global longitudinal strain is normal. 3D  ejection fraction reviewed and evaluated as part of the interpretation.  Alternate measurement of EF is felt to be most reflective of LV function.  The left ventricular internal cavity  size was normal in size. There is no left ventricular hypertrophy. Left  ventricular diastolic parameters are consistent with Grade II diastolic  dysfunction (pseudonormalization). Normal left  ventricular filling  pressure.   Right Ventricle: The right ventricular size is normal. No increase in  right ventricular wall thickness. Right ventricular systolic function is  normal. The tricuspid regurgitant velocity is 2.14 m/s, and with an  assumed right atrial pressure of 3 mmHg,  the estimated right ventricular systolic pressure is 21.3 mmHg.   Left Atrium: Left atrial size was mildly dilated.   Right Atrium: Right atrial size was normal in size.   Pericardium: There is no evidence of pericardial effusion.   Mitral Valve: The mitral valve is normal in structure. Trivial mitral  valve regurgitation. No evidence of mitral valve stenosis.   Tricuspid Valve: The tricuspid valve is normal in structure. Tricuspid  valve regurgitation is trivial. No evidence of tricuspid stenosis.   Aortic Valve: The aortic valve is normal in structure. Aortic valve  regurgitation is mild. Aortic regurgitation PHT measures 549 msec. Aortic  valve sclerosis/calcification is present, without any evidence of aortic  stenosis.   Pulmonic Valve: The pulmonic valve was normal in structure. Pulmonic valve  regurgitation is trivial. No evidence of pulmonic stenosis.   Aorta: Aortic dilatation noted. There is an aneurysm involving the aortic  root measuring 46 mm.   Venous: The inferior vena cava is normal in size with greater than 50%  respiratory variability, suggesting right atrial pressure of 3 mmHg.   IAS/Shunts: There is redundancy of the interatrial septum. The interatrial  septum appears to be lipomatous. Cannot exclude a small PFO.      Assessment & Plan   1.  ***   Josefa HERO. Derrian Rodak NP-C     09/23/2024, 6:20 AM [facility] [address] [place], [place] [address] Office [phone]    Notice: This dictation was prepared with Dragon dictation along with smaller phrase technology. Any transcriptional errors that result from this process are  unintentional and may not be corrected upon review.   I spent***minutes examining this patient, reviewing medications, and using patient centered shared decision making involving their cardiac care.   I spent  20 minutes reviewing past medical history,  medications, and prior cardiac tests.

## 2024-09-24 ENCOUNTER — Ambulatory Visit: Admitting: General Practice

## 2024-10-09 ENCOUNTER — Telehealth: Payer: Self-pay

## 2024-10-09 NOTE — Telephone Encounter (Signed)
 Spoke with pt, he is scheduled for 1/5 at 10 AM to see Dr. Wonda.

## 2024-10-09 NOTE — Telephone Encounter (Signed)
-----   Message from Ozell Fell, MD sent at 10/09/2024  1:06 PM EST ----- Regarding: RE: Possible Cath Pt Sure Alm I'd be happy to see him to look over everything with his CAD management. I'll copy my nurse, Keven, and we'll make arrangements to get him in.  Thx [facility] ----- Message ----- From: Remonia Alm PARAS, MD Sent: 10/08/2024  12:40 PM EST To: Ozell Fell, MD Subject: Possible Cath Pt                               Dr Fell,    This is Alm Remonia. I'm one of the [facility] Hospitalists. I also work in an outpatient clinic not associated w/ [facility]. I'm worried about one of my pts w/ known CAD and development of recent Paroxysmal Afib. I think he needs a cath and potentially a stent. He's met with one cardiologist a short time ago but was told medical management would be best. His Afib developed after that initial tx plan was suggested. He is set to have an EP Cards appt in early 2026. I was wondering if I could send him your direction for a second opinion. If you are game to see him, please let me know and I'll have him call to get scheduled. I've attached his Epic chart to this email.   Alm

## 2024-10-09 NOTE — Telephone Encounter (Signed)
 Attempted to call pt, unable to reach. LMTCB to discuss scheduling appt for 1/5 at 10 AM (add-on). Pt told to call our office back.

## 2024-10-26 ENCOUNTER — Encounter: Payer: Self-pay | Admitting: Cardiovascular Disease

## 2024-10-26 ENCOUNTER — Ambulatory Visit: Attending: Cardiovascular Disease | Admitting: Cardiovascular Disease

## 2024-10-26 VITALS — BP 132/80 | HR 53 | Ht 72.0 in | Wt 201.4 lb

## 2024-10-26 DIAGNOSIS — I480 Paroxysmal atrial fibrillation: Secondary | ICD-10-CM | POA: Diagnosis not present

## 2024-10-26 DIAGNOSIS — I2510 Atherosclerotic heart disease of native coronary artery without angina pectoris: Secondary | ICD-10-CM | POA: Diagnosis not present

## 2024-10-26 DIAGNOSIS — I2584 Coronary atherosclerosis due to calcified coronary lesion: Secondary | ICD-10-CM | POA: Diagnosis not present

## 2024-10-26 DIAGNOSIS — R9439 Abnormal result of other cardiovascular function study: Secondary | ICD-10-CM

## 2024-10-26 DIAGNOSIS — R931 Abnormal findings on diagnostic imaging of heart and coronary circulation: Secondary | ICD-10-CM | POA: Diagnosis not present

## 2024-10-26 MED ORDER — NITROGLYCERIN 0.4 MG SL SUBL: 0.4000 mg | SUBLINGUAL_TABLET | SUBLINGUAL | 0 refills | Status: AC | PRN

## 2024-10-26 NOTE — Progress Notes (Signed)
 " Cardiology Office Note:    Date:  10/26/2024   ID:  John Odonnell, DOB 01-09-1966, MRN [id number]  PCP:  Remonia Alm PARAS, MD   [facility] Providers Cardiologist:  Ozell Fell, MD     Referring MD: Remonia Alm PARAS, MD   Chief Complaint  Patient presents with   Coronary Artery Disease    History of Present Illness:    John Odonnell is a 59 y.o. male presenting for cardiology follow-up evaluation.  The patient has been followed by Dr. Michele, and this is his first visit with me.  He was last seen July 31, 2024.  He spoke with his primary care physician and requested a second opinion on management of his coronary artery disease.  He has a history of atrial fibrillation back in 2016, with no recurrence.  He has been noted to have severe coronary artery calcification with a coronary calcium  score of 3490, placing him in the 99th percentile.  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  Comorbid conditions include hyperlipidemia and hypertension.  Echo assessment has shown normal LVEF of 55 to 60% with grade 2 diastolic dysfunction, normal RV size and function, and mild aortic regurgitation.  Cardiac catheterization has demonstrated mild nonobstructive disease in the LAD and left circumflex vessels.  He had severe calcified stenosis of the RCA which was normal by PressureWire assessment with an RFR of 0.95.  His most recent CT of the chest showed stable aortic root dilatation with 4.6 to 4.7 cm aorta at the level of the sinuses of Valsalva.  The patient is here alone today. He walks 2 miles in the mornings and exercises in the gym twice per week. He swims once/week in the summer months. He denies 'angina' with any of those activities. He has no chest pain, chest pressure, or chest tightness. He took NTG once when he felt short of breath, but reports no change in symptoms with this. He used to experience shortness of breath with activity, but no longer has this  since he has been exercising regularly.  He recently noticed heart palpitations and was alerted to the presence of atrial fibrillation on his smart watch.  He was seen in the emergency department and he converted back to sinus rhythm.  He had follow-up in the atrial fibrillation clinic and decision was made to manage him conservatively.  He is now taking apixaban  and he has metoprolol succinate was increased.  To his knowledge he has not had any recurrence of atrial fibrillation.  He did not have any chest pain or pressure during his episode of atrial fibrillation.  Past Medical History:  Diagnosis Date   Agatston coronary artery calcium  score greater than 400    Atrial fibrillation (HCC) 2016   TEE put back into rythmn   GERD (gastroesophageal reflux disease)    Hyperlipidemia    Hypertension    Past Surgical History:  Procedure Laterality Date   ANTERIOR CRUCIATE LIGAMENT REPAIR  10/22/1993   Left knee   COLONOSCOPY     CORONARY ANGIOGRAPHY N/A 11/16/2022   Procedure: CORONARY ANGIOGRAPHY;  Surgeon: Ladona Heinz, MD;  Location: [facility];  Service: Cardiovascular;  Laterality: N/A;   CORONARY PRESSURE/FFR STUDY N/A 11/16/2022   Procedure: INTRAVASCULAR PRESSURE WIRE/FFR STUDY;  Surgeon: Ladona Heinz, MD;  Location: [facility];  Service: Cardiovascular;  Laterality: N/A;   HERNIA REPAIR     POLYPECTOMY     TONSILLECTOMY AND ADENOIDECTOMY  Current Medications: Active Medications[1]   Allergies:   Patient has no known allergies.   ROS:   Please see the history of present illness.    All other systems reviewed and are negative.  EKGs/Labs/Other Studies Reviewed:    The following studies were reviewed today: Cardiac Studies & Procedures   ______________________________________________________________________________________________ CARDIAC CATHETERIZATION  CARDIAC CATHETERIZATION 11/16/2022  Conclusion Images from the original result were not included.    Ost  RCA to Prox RCA lesion is 30% stenosed.  Left Heart Catheterization 11/16/22: LM: Large-caliber vessel, smooth and normal, mildly calcified. LAD: Large-caliber vessel in the proximal segment, mild diffuse disease in the proximal segment, mild to moderate calcification is also evident especially in the proximal segment with a 30% stenosis.  Mid segment has a long tubular 30% stenosis. RI: Large-caliber vessel.  Smooth and normal. CX: Moderate caliber vessel in proximal segment with mild disease in the proximal segment of 30%.  He was origin to a moderate-sized OM1 and OM 2.  Mild disease is evident in the mid circumflex. RCA: Very large caliber vessel, superdominant, diffuse luminal irregularity in the proximal and mid segment with severe calcification of the proximal to mid to distal segment of the right coronary artery.  There is a large calcific spicule in the mid segment of the RCA giving a 90% stenosis appearance in some views and a 50% appearance in the other views. Pressure wire performed, CFR was 0.95.  Not hemodynamically significant.    Impression: Moderate coronary artery disease involving the LAD and circumflex, severe calcification of a very large superdominant right coronary artery with a complex mid RCA stenosis which appears to be 80 to 90% but not hemodynamically significant, suspect medial calcification.  Aggressive risk factor modification is indicated.  Aspirin  was discontinued, patient was started on Plavix .  Patient also has slow flow in all the coronary vessels suggestive of microvascular dysfunction.  Findings Coronary Findings Diagnostic  Dominance: Right  Left Anterior Descending Prox LAD lesion is 20% stenosed. Mid LAD lesion is 30% stenosed.  Second Diagonal Branch 2nd Diag lesion is 50% stenosed.  Ramus Intermedius Vessel is angiographically normal.  Left Circumflex Ost Cx to Prox Cx lesion is 30% stenosed.  Right Coronary Artery Ost RCA to Prox RCA lesion  is 30% stenosed. Mid RCA-1 lesion is 90% stenosed. The lesion is irregular. The lesion is calcified. Pressure wire/FFR was performed on the lesion. FFR: 0.95. Mid RCA-2 lesion is 60% stenosed.  Intervention  No interventions have been documented.   STRESS TESTS  PCV MYOCARDIAL PERFUSION WO LEXISCAN  10/30/2022  Interpretation Summary Exercise Myoview stress test 10/30/2022: Exercise nuclear stress test was performed using Bruce protocol. 1 Day Rest and Stress images. Exercise time 8 minutes 52 seconds on Bruce protocol, achieved 10.16 METS, 88% of APMHR. Stress ECG negative for ischemia. Small size, moderate intensity, reversible perfusion defect suggestive of ischemia in mid to distal LAD/diagonal distribution. Medium size, mild to moderate intensity, reversible perfusion defect suggestive of ischemia in the RCA/LCx distribution. Calculated LVEF 51%, visually appears preserved. Wall thickness is preserved and no obvious regional wall motion abnormalities. No prior studies available for comparison. Intermediate risk study.   ECHOCARDIOGRAM  ECHOCARDIOGRAM COMPLETE 12/27/2023  Narrative ECHOCARDIOGRAM REPORT    Patient Name:   John Odonnell   Date of Exam: 12/27/2023 Medical Rec #:  [id number]     Height:       72.0 in Accession #:    [id number]    Weight:  199.0 lb Date of Birth:  07-01-1966     BSA:          2.126 m Patient Age:    57 years      BP:           130/82 mmHg Patient Gender: M             HR:           61 bpm. Exam Location:  [address]  Procedure: 2D Echo, 3D Echo, Cardiac Doppler, Color Doppler and Strain Analysis (Both Spectral and Color Flow Doppler were utilized during procedure).  Indications:    Aortic valve disorder I35.9 Aortic root dilatation  History:        Patient has prior history of Echocardiogram examinations, most recent 11/02/2022. Arrythmias:Atrial Fibrillation; Risk Factors:Dyslipidemia and Hypertension.  Sonographer:    Lauraine Pilot RDCS Referring Phys: [id number] MADONNA LARGE   Sonographer Comments: Global longitudinal strain was attempted. IMPRESSIONS   1. Left ventricular ejection fraction, by estimation, is 55 to 60%. The left ventricle has normal function. The left ventricle has no regional wall motion abnormalities. Left ventricular diastolic parameters are consistent with Grade II diastolic dysfunction (pseudonormalization). The average left ventricular global longitudinal strain is -23.3 %. The global longitudinal strain is normal. 2. Right ventricular systolic function is normal. The right ventricular size is normal. The estimated right ventricular systolic pressure is 21.3 mmHg. 3. Left atrial size was mildly dilated. 4. The mitral valve is normal in structure. Trivial mitral valve regurgitation. No evidence of mitral stenosis. 5. The aortic valve is normal in structure. Aortic valve regurgitation is mild. Aortic valve sclerosis/calcification is present, without any evidence of aortic stenosis. Aortic regurgitation PHT measures 549 msec. 6. Aortic dilatation noted. Aneurysm of the aortic root, measuring 46 mm. 7. The inferior vena cava is normal in size with greater than 50% respiratory variability, suggesting right atrial pressure of 3 mmHg. 8. Cannot exclude a small PFO (suggustive in the PSAX view). Recommend agitated saline contrast study.  FINDINGS Left Ventricle: Left ventricular ejection fraction, by estimation, is 55 to 60%. The left ventricle has normal function. The left ventricle has no regional wall motion abnormalities. The average left ventricular global longitudinal strain is -23.3 %. Strain was performed and the global longitudinal strain is normal. 3D ejection fraction reviewed and evaluated as part of the interpretation. Alternate measurement of EF is felt to be most reflective of LV function. The left ventricular internal cavity size was normal in size. There is no left ventricular  hypertrophy. Left ventricular diastolic parameters are consistent with Grade II diastolic dysfunction (pseudonormalization). Normal left ventricular filling pressure.  Right Ventricle: The right ventricular size is normal. No increase in right ventricular wall thickness. Right ventricular systolic function is normal. The tricuspid regurgitant velocity is 2.14 m/s, and with an assumed right atrial pressure of 3 mmHg, the estimated right ventricular systolic pressure is 21.3 mmHg.  Left Atrium: Left atrial size was mildly dilated.  Right Atrium: Right atrial size was normal in size.  Pericardium: There is no evidence of pericardial effusion.  Mitral Valve: The mitral valve is normal in structure. Trivial mitral valve regurgitation. No evidence of mitral valve stenosis.  Tricuspid Valve: The tricuspid valve is normal in structure. Tricuspid valve regurgitation is trivial. No evidence of tricuspid stenosis.  Aortic Valve: The aortic valve is normal in structure. Aortic valve regurgitation is mild. Aortic regurgitation PHT measures 549 msec. Aortic valve sclerosis/calcification is present, without any evidence of  aortic stenosis.  Pulmonic Valve: The pulmonic valve was normal in structure. Pulmonic valve regurgitation is trivial. No evidence of pulmonic stenosis.  Aorta: Aortic dilatation noted. There is an aneurysm involving the aortic root measuring 46 mm.  Venous: The inferior vena cava is normal in size with greater than 50% respiratory variability, suggesting right atrial pressure of 3 mmHg.  IAS/Shunts: There is redundancy of the interatrial septum. The interatrial septum appears to be lipomatous. Cannot exclude a small PFO.  Additional Comments: 3D was performed not requiring image post processing on an independent workstation and was indeterminate.   LEFT VENTRICLE PLAX 2D LVIDd:         5.00 cm   Diastology LVIDs:         3.20 cm   LV e' medial:    6.66 cm/s LV PW:         0.90 cm    LV E/e' medial:  8.7 LV IVS:        0.90 cm   LV e' lateral:   13.10 cm/s LVOT diam:     2.50 cm   LV E/e' lateral: 4.4 LV SV:         127 LV SV Index:   60        2D Longitudinal Strain LVOT Area:     4.91 cm  2D Strain GLS (A4C):   -23.7 % 2D Strain GLS (A3C):   -23.5 % 2D Strain GLS (A2C):   -22.7 % 2D Strain GLS Avg:     -23.3 %  3D Volume EF: 3D EF:        53 % LV EDV:       195 ml LV ESV:       91 ml LV SV:        104 ml  RIGHT VENTRICLE RV S prime:     16.40 cm/s TAPSE (M-mode): 2.2 cm  LEFT ATRIUM             Index        RIGHT ATRIUM           Index LA diam:        4.40 cm 2.07 cm/m   RA Area:     21.00 cm LA Vol (A2C):   76.1 ml 35.79 ml/m  RA Volume:   56.30 ml  26.48 ml/m LA Vol (A4C):   78.2 ml 36.78 ml/m LA Biplane Vol: 76.2 ml 35.84 ml/m AORTIC VALVE                   PULMONIC VALVE LVOT Vmax:         112.00 cm/s PR End Diast Vel: 3.05 msec LVOT Vmean:        73.900 cm/s LVOT VTI:          0.259 m AI PHT:            549 msec AR Vena Contracta: 0.30 cm  AORTA Ao Root diam: 4.60 cm Ao Asc diam:  3.65 cm  MITRAL VALVE               TRICUSPID VALVE MV Area (PHT): 3.42 cm    TR Peak grad:   18.3 mmHg MV Decel Time: 222 msec    TR Vmax:        214.00 cm/s MV E velocity: 58.10 cm/s MV A velocity: 56.60 cm/s  SHUNTS MV E/A ratio:  1.03        Systemic VTI:  0.26 m  Systemic Diam: 2.50 cm  Wilbert Bihari MD Electronically signed by Wilbert Bihari MD Signature Date/Time: 12/27/2023/9:23:40 AM    Final          ______________________________________________________________________________________________      EKG:   EKG Interpretation Date/Time:  Monday October 26 2024 10:08:25 EST Ventricular Rate:  49 PR Interval:  136 QRS Duration:  84 QT Interval:  444 QTC Calculation: 401 R Axis:   55  Text Interpretation: Sinus bradycardia When compared with ECG of 11-Sep-2024 10:38, No significant change was found Confirmed by Wonda Sharper [id number])  on 10/26/2024 10:18:23 AM    Recent Labs: 09/07/2024: BUN 14; Creatinine, Ser 0.96; Hemoglobin 14.0; Magnesium 2.1; Platelets 253; Potassium 3.8; Sodium 136  Recent Lipid Panel    Component Value Date/Time   CHOL 196 07/24/2013 1145   TRIG 137 07/24/2013 1145   HDL 47 07/24/2013 1145   CHOLHDL 4.2 07/24/2013 1145   VLDL 27 07/24/2013 1145   LDLCALC 122 (H) 07/24/2013 1145     Risk Assessment/Calculations:    CHA2DS2-VASc Score = 2   This indicates a 2.2% annual risk of stroke. The patient's score is based upon: CHF History: 0 HTN History: 1 Diabetes History: 0 Stroke History: 0 Vascular Disease History: 1 Age Score: 0 Gender Score: 0               Physical Exam:    VS:  BP 132/80 (BP Location: Left Arm, Patient Position: Sitting, Cuff Size: Normal)   Pulse (!) 53   Ht 6' (1.829 m)   Wt 201 lb 6.4 oz (91.4 kg)   SpO2 98%   BMI 27.31 kg/m     Wt Readings from Last 3 Encounters:  10/26/24 201 lb 6.4 oz (91.4 kg)  09/11/24 197 lb 3.2 oz (89.4 kg)  09/07/24 195 lb (88.5 kg)     GEN:  Well nourished, well developed in no acute distress HEENT: Normal NECK: No JVD; No carotid bruits LYMPHATICS: No lymphadenopathy CARDIAC: RRR, no murmurs, rubs, gallops RESPIRATORY:  Clear to auscultation without rales, wheezing or rhonchi  ABDOMEN: Soft, non-tender, non-distended MUSCULOSKELETAL:  No edema; No deformity  SKIN: Warm and dry NEUROLOGIC:  Alert and oriented x 3 PSYCHIATRIC:  Normal affect   Assessment & Plan Paroxysmal atrial fibrillation (HCC) Started on apixaban , recent notes from the atrial fibrillation clinic reviewed.  Treated with a beta-blocker. Agatston CAC score, >400 Treated with atorvastatin  and evolocumab  Coronary atherosclerosis due to calcified coronary lesion I went back and reviewed his previous studies which included an abnormal exercise stress Myoview scan in January 2024, demonstrating ischemia in the RCA distribution.  This led to his heart  catheterization which demonstrated severe calcific mid RCA stenosis with a calcified nodule and an eccentric 80% stenosis.  PressureWire analysis was performed and he had negative findings, therefore medical management was recommended.  The patient now presents for second opinion and further discussion about management of his coronary artery disease.  I discussed the natural history of obstructive CAD with him.  We reviewed the importance of associated symptoms and he appears to be completely asymptomatic at this point.  It is notable that he avoids high-level exercise and performs moderate walking and resistance training.  We discussed the pros and cons of intervention and he would almost certainly require plaque modification with either orbital atherectomy or intravascular lithotripsy due to the heavy associated calcification.  We also discussed and contrasted medical management versus PCI.  I think with his lack of symptoms, I would  have a fairly high threshold to perform intervention.  I think it is reasonable to perform surveillance stress testing as it has been 2 years since his previous study.  As long as he does not have high risk features, I agree that ongoing medical management is indicated. Abnormal nuclear stress test The patient has a history of abnormal stress test.  See discussion above.       Informed Consent   Shared Decision Making/Informed Consent The risks [chest pain, shortness of breath, cardiac arrhythmias, dizziness, blood pressure fluctuations, myocardial infarction, stroke/transient ischemic attack, nausea, vomiting, allergic reaction, radiation exposure, metallic taste sensation and life-threatening complications (estimated to be 1 in 10,000)], benefits (risk stratification, diagnosing coronary artery disease, treatment guidance) and alternatives of a nuclear stress test were discussed in detail with Mr. Dusza and he agrees to proceed.       Medication Adjustments/Labs and  Tests Ordered: Current medicines are reviewed at length with the patient today.  Concerns regarding medicines are outlined above.  Orders Placed This Encounter  Procedures   EKG 12-Lead   Meds ordered this encounter  Medications   nitroGLYCERIN  (NITROSTAT ) 0.4 MG SL tablet    Sig: Place 1 tablet (0.4 mg total) under the tongue every 5 (five) minutes as needed for chest pain. If you require more than two tablets five minutes apart go to the nearest ER via EMS.    Dispense:  30 tablet    Refill:  0    There are no Patient Instructions on file for this visit.   Signed, Ozell Fell, MD  10/26/2024 10:31 AM    [facility]     [1]  Current Meds  Medication Sig   acetaminophen  (TYLENOL ) 500 MG tablet Take 1,000 mg by mouth every 6 (six) hours as needed for moderate pain.   apixaban  (ELIQUIS ) 5 MG TABS tablet Take 1 tablet (5 mg total) by mouth 2 (two) times daily.   atorvastatin  (LIPITOR) 20 MG tablet Take 1 tablet (20 mg total) by mouth at bedtime.   Cholecalciferol (VITAMIN D3) 50 MCG (2000 UT) CHEW Chew 1 capsule by mouth daily.   Cyanocobalamin (HM SUPER VITAMIN B12) 2500 MCG CHEW Chew 1 capsule by mouth daily.   Evolocumab  (REPATHA  SURECLICK) 140 MG/ML SOAJ Inject 140 mg into the skin every 14 (fourteen) days.   hydrochlorothiazide (HYDRODIURIL) 50 MG tablet Take 50 mg by mouth daily.   Ketotifen Fumarate (ITCHY EYE DROPS OP) Place 1 drop into both eyes daily as needed (itchy eyes).   metoprolol succinate (TOPROL-XL) 25 MG 24 hr tablet Take 25 mg by mouth daily. (Patient taking differently: Take 50 mg by mouth daily.)   Multiple Vitamins-Minerals (CENTRUM SILVER 50+MEN PO) Take by mouth.   omeprazole (PRILOSEC) 20 MG capsule Take 20 mg by mouth daily.   valsartan  (DIOVAN ) 320 MG tablet Take 1 tablet (320 mg total) by mouth at bedtime.   [DISCONTINUED] nitroGLYCERIN  (NITROSTAT ) 0.4 MG SL tablet Place 1 tablet (0.4 mg total) under the tongue every 5 (five) minutes as  needed for chest pain. If you require more than two tablets five minutes apart go to the nearest ER via EMS.   "

## 2024-10-26 NOTE — Patient Instructions (Addendum)
 Medication Instructions:  No medication changes were made at this visit. Continue current regimen.   *If you need a refill on your cardiac medications before your next appointment, please call your pharmacy*  Lab Work: None ordered today. If you have labs (blood work) drawn today and your tests are completely normal, you will receive your results only by: MyChart Message (if you have MyChart) OR A paper copy in the mail If you have any lab test that is abnormal or we need to change your treatment, we will call you to review the results.  Testing/Procedures: Your physician has requested that you have en exercise stress myoview. For further information please visit [email or web address] Please follow instruction sheet, as given.   Follow-Up: At [facility], you and your health needs are our priority.  As part of our continuing mission to provide you with exceptional heart care, our providers are all part of one team.  This team includes your primary Cardiologist (physician) and Advanced Practice Providers or APPs (Physician Assistants and Nurse Practitioners) who all work together to provide you with the care you need, when you need it.  Provider:   Madonna Large, DO    We recommend signing up for the patient portal called MyChart.  Sign up information is provided on this After Visit Summary.  MyChart is used to connect with patients for Virtual Visits (Telemedicine).  Patients are able to view lab/test results, encounter notes, upcoming appointments, etc.  Non-urgent messages can be sent to your provider as well.   To learn more about what you can do with MyChart, go to [email or web address].   Other Instructions Exercise Myoview (Stress Test) Instructions  Please arrive 15 minutes prior to your appointment time for registration and insurance purposes.   The test will take approximately 3 to 4 hours to complete; you may bring reading material.  If someone comes with you to your  appointment, they will need to remain in the main lobby due to limited space in the testing area. **If you are pregnant or breastfeeding, please notify the nuclear lab prior to your appointment**   How to prepare for your Myocardial Perfusion Test: Do not eat or drink 3 hours prior to your test, except you may have water. Do not consume products containing caffeine (regular or decaffeinated) 12 hours prior to your test. (ex: coffee, chocolate, sodas, tea). Do bring a list of your current medications with you.  If not listed below, you may take your medications as normal. HOLD Metoprolol the morning of test Do wear comfortable clothes (no dresses or overalls) and walking shoes, tennis shoes preferred (No heels or open toe shoes are allowed). Do NOT wear cologne, perfume, aftershave, or lotions (deodorant is allowed). If these instructions are not followed, your test will have to be rescheduled.   Please report to [address], [place], [place] [address] for your test.  If you have questions or concerns about your appointment, you can call the Nuclear Lab at [phone].   If you cannot keep your appointment, please provide 24 hours notification to the Nuclear Lab, to avoid a possible $50 charge to your account.

## 2024-11-05 ENCOUNTER — Other Ambulatory Visit: Payer: Self-pay | Admitting: Cardiovascular Disease

## 2024-11-05 DIAGNOSIS — I2510 Atherosclerotic heart disease of native coronary artery without angina pectoris: Secondary | ICD-10-CM

## 2024-11-05 DIAGNOSIS — R9439 Abnormal result of other cardiovascular function study: Secondary | ICD-10-CM

## 2024-11-05 DIAGNOSIS — I480 Paroxysmal atrial fibrillation: Secondary | ICD-10-CM

## 2024-11-09 ENCOUNTER — Telehealth (HOSPITAL_COMMUNITY): Payer: Self-pay | Admitting: *Deleted

## 2024-11-09 ENCOUNTER — Encounter (HOSPITAL_COMMUNITY): Payer: Self-pay | Admitting: *Deleted

## 2024-11-09 NOTE — Telephone Encounter (Signed)
 Patient given detailed instructions per Myocardial Perfusion Study Information Sheet for the test on 11/11/24  Patient notified to arrive 15 minutes early and that it is imperative to arrive on time for appointment to keep from having the test rescheduled.  If you need to cancel or reschedule your appointment, please call the office within 24 hours of your appointment. . Patient verbalized understanding.Claudene Ronal Quale, RN

## 2024-11-11 ENCOUNTER — Ambulatory Visit: Payer: Self-pay | Admitting: Cardiovascular Disease

## 2024-11-11 ENCOUNTER — Ambulatory Visit (HOSPITAL_COMMUNITY)
Admission: RE | Admit: 2024-11-11 | Discharge: 2024-11-11 | Disposition: A | Discharge status: Home / Self Care | Source: Ambulatory Visit | Attending: Cardiology | Admitting: Cardiology

## 2024-11-11 DIAGNOSIS — I480 Paroxysmal atrial fibrillation: Secondary | ICD-10-CM | POA: Insufficient documentation

## 2024-11-11 DIAGNOSIS — R9439 Abnormal result of other cardiovascular function study: Secondary | ICD-10-CM | POA: Diagnosis not present

## 2024-11-11 DIAGNOSIS — I2584 Coronary atherosclerosis due to calcified coronary lesion: Secondary | ICD-10-CM | POA: Diagnosis not present

## 2024-11-11 DIAGNOSIS — I2510 Atherosclerotic heart disease of native coronary artery without angina pectoris: Secondary | ICD-10-CM | POA: Insufficient documentation

## 2024-11-11 LAB — MYOCARDIAL PERFUSION IMAGING
Angina Index: 0
Estimated workload: 12.8
Exercise duration (min): 10 min
Exercise duration (sec): 40 s
LV dias vol: 121 mL (ref 62–150)
LV sys vol: 56 mL
MPHR: 164 {beats}/min
Nuc Stress EF: 54 %
Peak HR: 153 {beats}/min
Percent HR: 93 %
Rest HR: 60 {beats}/min
Rest Nuclear Isotope Dose: 10.3 mCi
SDS: 10
SRS: 1
SSS: 10
Stress Nuclear Isotope Dose: 31.7 mCi
TID: 0.77

## 2024-11-11 MED ORDER — TECHNETIUM TC 99M TETROFOSMIN IV KIT
11.3000 | PACK | Freq: Once | INTRAVENOUS | Status: AC | PRN
  Administered 2024-11-11: 10.3 via INTRAVENOUS

## 2024-11-11 MED ORDER — TECHNETIUM TC 99M TETROFOSMIN IV KIT
31.7000 | PACK | Freq: Once | INTRAVENOUS | Status: AC | PRN
  Administered 2024-11-11: 31.7 via INTRAVENOUS

## 2024-11-13 ENCOUNTER — Telehealth: Payer: Self-pay | Admitting: Cardiovascular Disease

## 2024-11-13 ENCOUNTER — Other Ambulatory Visit: Payer: Self-pay

## 2024-11-13 DIAGNOSIS — R9439 Abnormal result of other cardiovascular function study: Secondary | ICD-10-CM

## 2024-11-13 DIAGNOSIS — Z01812 Encounter for preprocedural laboratory examination: Secondary | ICD-10-CM

## 2024-11-13 NOTE — Telephone Encounter (Signed)
" °  Per MyChart Scheduling message:  I need to schedule a heart catheter procedure with Dr. Wonda as a result of an abnormal nuclear stress test on 11/11/2024.   "

## 2024-11-13 NOTE — Telephone Encounter (Signed)
 Spoke with pt and offered to schedule his cath for 2/12 with Dr. Wonda. Pt accepted. LHC has been scheduled for pt on 12/03/24 at 11 AM with Dr. Wonda. MyChart message will be sent to pt with cath instructions.

## 2024-11-18 ENCOUNTER — Other Ambulatory Visit (HOSPITAL_COMMUNITY): Payer: Self-pay | Admitting: *Deleted

## 2024-11-18 MED ORDER — APIXABAN 5 MG PO TABS: 5.0000 mg | ORAL_TABLET | Freq: Two times a day (BID) | ORAL | 1 refills | Status: AC

## 2024-11-23 ENCOUNTER — Telehealth: Payer: Self-pay | Admitting: *Deleted

## 2024-11-23 NOTE — Telephone Encounter (Addendum)
-----  Copied from Dr Wonda staff message 11/18/24---- Yes - eliquis  hold is right. He should start ASA 81 mg daily the day he stops eliquis . He should take clopidogrel  300 mg the day before the procedure and 75 mg the morning of the procedure. thx   Cardiac Catheterization scheduled at [facility] for: Thursday December 03, 2024 11 AM Arrival time [facility] Main Entrance A at: 9 AM  Diet: -Nothing to eat after midnight.  Hydration: -May drink clear liquids until 2 hours before the procedure.  Approved liquids: Water, clear tea, black coffee, fruit juices-non-citric and without pulp,Gatorade, plain Jello/popsicles.   -Please drink 16 oz of water 2 hours before procedure.  Medication instructions: -Hold:  Hydrochlorothiazide-AM of procedure   Eliquis -none starting 12/01/24 until post procedure-pt knows to start aspirin  81 mg this day per Dr Wonda -Other usual morning medications can be taken including aspirin  81 mg and Plavix  75 mg  Pt knows to start Plavix  300 mg daily 12/02/24, then take Plavix  75 mg morning of procedure  Plan to go home the same day, you will only stay overnight if medically necessary.  You must have responsible adult to drive you home.  Someone must be with you the first 24 hours after you arrive home.

## 2024-11-25 NOTE — Telephone Encounter (Addendum)
" ° ° °  You are scheduled for a Cardiac Catheterization on Thursday, February 12 with Dr. Ozell Fell.  -Please arrive at the [facility] (Main Entrance A) at [facility]: [address] [place], [place] [address] at 9 AM . This time is 2 hour(s) before your procedure time at 11 AM to ensure your preparation.  Free valet parking service is available. You will check in at ADMITTING. The support person will be asked to wait in the waiting room.  It is OK to have someone drop you off and come back when you are ready to be discharged.        Special note: Every effort is made to have your procedure done on time. Please understand that emergencies sometimes delay scheduled procedures.  - Diet: Nothing to eat after midnight.  -Hydration:You need to be well hydrated before your procedure. You may drink approved liquids (see below) until 2 hours (9 AM)  before the procedure.  Please drink 16 oz of water 2 hours before the procedure.   List of approved liquids water, clear juice, clear tea, black coffee, fruit juices, non-citric and without pulp, carbonated beverages, Gatorade, Kool -Aid, plain Jello-O and plain ice popsicles.  - Medication instructions:  Stop taking apixaban  (Eliquis ) on Tuesday February 10.  Start aspirin  81 mg daily on Tuesday February 10 (the day you stop taking apixaban ).  Take clopidogrel  (Plavix ) 300 mg the day before the procedure (Wednesday) and 75 mg the morning of the procedure.   Do not take hydrochlorothiazide (Hydrodiuril) the morning of the procedure.  On the morning of your procedure take aspirin  81 mg and clopidogrel  (Plavix ) 75 mg and any of your other usual morning medications except those listed above.  -Plan to go home the same day, you will only stay overnight if medically necessary. -You MUST have a responsible adult to drive you home. -An adult MUST be with you the first 24 hours after you arrive home. - Bring a current list of your medications, and  the last time and date medication taken. - Bring ID and current insurance cards. -Please wear clothes that are easy to get on and off and wear slip-on shoes.  Thank you for allowing us  to care for you!   -- [facility]   11/24/24 Lab results (BMP/CBC) are in Epic under the [facility] tab.  "

## 2024-11-25 NOTE — Telephone Encounter (Signed)
 11/24/24 BMP/CBC results are in [facility].

## 2024-11-26 MED ORDER — PANTOPRAZOLE SODIUM 40 MG PO TBEC: 40.0000 mg | DELAYED_RELEASE_TABLET | Freq: Every day | ORAL | 3 refills | Status: AC

## 2024-11-26 NOTE — Telephone Encounter (Addendum)
 Per Dr Jettie will be starting clopidogrel -stop omeprazole and start pantoprazole  40 mg daily-pt is aware.

## 2024-12-03 ENCOUNTER — Encounter (HOSPITAL_COMMUNITY): Admission: RE | Payer: Self-pay | Source: Home / Self Care

## 2024-12-03 ENCOUNTER — Ambulatory Visit (HOSPITAL_COMMUNITY): Admission: RE | Admit: 2024-12-03 | Source: Home / Self Care | Admitting: Cardiovascular Disease

## 2025-03-12 ENCOUNTER — Ambulatory Visit (HOSPITAL_COMMUNITY): Admitting: Internal Medicine
